# Patient Record
Sex: Female | Born: 1945 | Race: Black or African American | Hispanic: No | Smoking: Never smoker
Health system: Southern US, Community
[De-identification: ages and names within clinical notes are randomized; demographics above are authoritative.]

## PROBLEM LIST (undated history)

## (undated) DIAGNOSIS — Z8673 Personal history of transient ischemic attack (TIA), and cerebral infarction without residual deficits: Secondary | ICD-10-CM

## (undated) DIAGNOSIS — E1129 Type 2 diabetes mellitus with other diabetic kidney complication: Secondary | ICD-10-CM

## (undated) DIAGNOSIS — R601 Generalized edema: Secondary | ICD-10-CM

## (undated) DIAGNOSIS — I480 Paroxysmal atrial fibrillation: Secondary | ICD-10-CM

## (undated) DIAGNOSIS — E7800 Pure hypercholesterolemia, unspecified: Secondary | ICD-10-CM

## (undated) DIAGNOSIS — N183 Chronic kidney disease, stage 3 unspecified: Secondary | ICD-10-CM

## (undated) DIAGNOSIS — I10 Essential (primary) hypertension: Secondary | ICD-10-CM

## (undated) HISTORY — PX: OTHER SURGICAL HISTORY: SHX169

---

## 2015-02-19 ENCOUNTER — Encounter (HOSPITAL_COMMUNITY): Payer: Self-pay | Admitting: Internal Medicine

## 2015-02-19 ENCOUNTER — Inpatient Hospital Stay (HOSPITAL_COMMUNITY)
Admission: AD | Admit: 2015-02-19 | Discharge: 2015-02-23 | DRG: 683 | Disposition: A | Discharge status: Skilled Nursing Facility | Payer: Medicare Other | Source: Other Acute Inpatient Hospital | Attending: Internal Medicine | Admitting: Internal Medicine

## 2015-02-19 DIAGNOSIS — E1129 Type 2 diabetes mellitus with other diabetic kidney complication: Secondary | ICD-10-CM | POA: Diagnosis present

## 2015-02-19 DIAGNOSIS — N179 Acute kidney failure, unspecified: Secondary | ICD-10-CM | POA: Diagnosis present

## 2015-02-19 DIAGNOSIS — Z794 Long term (current) use of insulin: Secondary | ICD-10-CM

## 2015-02-19 DIAGNOSIS — D649 Anemia, unspecified: Secondary | ICD-10-CM | POA: Diagnosis present

## 2015-02-19 DIAGNOSIS — I69920 Aphasia following unspecified cerebrovascular disease: Secondary | ICD-10-CM

## 2015-02-19 DIAGNOSIS — E8770 Fluid overload, unspecified: Secondary | ICD-10-CM | POA: Diagnosis present

## 2015-02-19 DIAGNOSIS — E11649 Type 2 diabetes mellitus with hypoglycemia without coma: Secondary | ICD-10-CM | POA: Diagnosis present

## 2015-02-19 DIAGNOSIS — I10 Essential (primary) hypertension: Secondary | ICD-10-CM | POA: Diagnosis present

## 2015-02-19 DIAGNOSIS — J811 Chronic pulmonary edema: Secondary | ICD-10-CM

## 2015-02-19 DIAGNOSIS — Z8673 Personal history of transient ischemic attack (TIA), and cerebral infarction without residual deficits: Secondary | ICD-10-CM

## 2015-02-19 DIAGNOSIS — N184 Chronic kidney disease, stage 4 (severe): Secondary | ICD-10-CM | POA: Diagnosis present

## 2015-02-19 DIAGNOSIS — Z66 Do not resuscitate: Secondary | ICD-10-CM | POA: Diagnosis present

## 2015-02-19 DIAGNOSIS — E1122 Type 2 diabetes mellitus with diabetic chronic kidney disease: Secondary | ICD-10-CM | POA: Diagnosis present

## 2015-02-19 DIAGNOSIS — N170 Acute kidney failure with tubular necrosis: Secondary | ICD-10-CM

## 2015-02-19 DIAGNOSIS — Z7401 Bed confinement status: Secondary | ICD-10-CM | POA: Diagnosis not present

## 2015-02-19 DIAGNOSIS — N390 Urinary tract infection, site not specified: Secondary | ICD-10-CM | POA: Diagnosis present

## 2015-02-19 DIAGNOSIS — I129 Hypertensive chronic kidney disease with stage 1 through stage 4 chronic kidney disease, or unspecified chronic kidney disease: Secondary | ICD-10-CM | POA: Diagnosis present

## 2015-02-19 DIAGNOSIS — E780 Pure hypercholesterolemia: Secondary | ICD-10-CM | POA: Diagnosis present

## 2015-02-19 DIAGNOSIS — R601 Generalized edema: Secondary | ICD-10-CM | POA: Diagnosis not present

## 2015-02-19 DIAGNOSIS — R0602 Shortness of breath: Secondary | ICD-10-CM | POA: Diagnosis present

## 2015-02-19 DIAGNOSIS — N189 Chronic kidney disease, unspecified: Secondary | ICD-10-CM

## 2015-02-19 DIAGNOSIS — I69922 Dysarthria following unspecified cerebrovascular disease: Secondary | ICD-10-CM

## 2015-02-19 DIAGNOSIS — I480 Paroxysmal atrial fibrillation: Secondary | ICD-10-CM | POA: Diagnosis present

## 2015-02-19 DIAGNOSIS — Z6841 Body Mass Index (BMI) 40.0 and over, adult: Secondary | ICD-10-CM

## 2015-02-19 DIAGNOSIS — Z7982 Long term (current) use of aspirin: Secondary | ICD-10-CM | POA: Diagnosis not present

## 2015-02-19 HISTORY — DX: Essential (primary) hypertension: I10

## 2015-02-19 HISTORY — DX: Paroxysmal atrial fibrillation: I48.0

## 2015-02-19 HISTORY — DX: Personal history of transient ischemic attack (TIA), and cerebral infarction without residual deficits: Z86.73

## 2015-02-19 HISTORY — DX: Generalized edema: R60.1

## 2015-02-19 HISTORY — DX: Pure hypercholesterolemia, unspecified: E78.00

## 2015-02-19 HISTORY — DX: Chronic kidney disease, stage 3 (moderate): N18.3

## 2015-02-19 HISTORY — DX: Type 2 diabetes mellitus with other diabetic kidney complication: E11.29

## 2015-02-19 HISTORY — DX: Chronic kidney disease, stage 3 unspecified: N18.30

## 2015-02-19 LAB — PROTIME-INR
INR: 1.53 — ABNORMAL HIGH (ref 0.00–1.49)
PROTHROMBIN TIME: 18.6 s — AB (ref 11.6–15.2)

## 2015-02-19 LAB — GLUCOSE, CAPILLARY
GLUCOSE-CAPILLARY: 138 mg/dL — AB (ref 65–99)
GLUCOSE-CAPILLARY: 154 mg/dL — AB (ref 65–99)

## 2015-02-19 LAB — MRSA PCR SCREENING: MRSA BY PCR: NEGATIVE

## 2015-02-19 LAB — TSH: TSH: 1.03 u[IU]/mL (ref 0.350–4.500)

## 2015-02-19 MED ORDER — ONDANSETRON HCL 4 MG PO TABS: 4.0000 mg | ORAL_TABLET | Freq: Four times a day (QID) | ORAL | Status: DC | PRN

## 2015-02-19 MED ORDER — ASPIRIN 81 MG PO CHEW
81.0000 mg | CHEWABLE_TABLET | Freq: Every day | ORAL | Status: DC
  Administered 2015-02-20 – 2015-02-23 (×4): 81 mg via ORAL
  Filled 2015-02-19 (×4): qty 1

## 2015-02-19 MED ORDER — HYDROCODONE-ACETAMINOPHEN 5-325 MG PO TABS
1.0000 | ORAL_TABLET | ORAL | Status: DC | PRN
  Administered 2015-02-22 – 2015-02-23 (×4): 2 via ORAL
  Filled 2015-02-19 (×4): qty 2

## 2015-02-19 MED ORDER — INSULIN GLARGINE 100 UNIT/ML ~~LOC~~ SOLN
40.0000 [IU] | Freq: Every day | SUBCUTANEOUS | Status: DC
  Administered 2015-02-19: 40 [IU] via SUBCUTANEOUS
  Filled 2015-02-19 (×2): qty 0.4

## 2015-02-19 MED ORDER — ONDANSETRON HCL 4 MG/2ML IJ SOLN: 4.0000 mg | Freq: Four times a day (QID) | INTRAMUSCULAR | Status: DC | PRN

## 2015-02-19 MED ORDER — POLYETHYLENE GLYCOL 3350 17 G PO PACK
17.0000 g | PACK | Freq: Every day | ORAL | Status: DC
  Administered 2015-02-20 – 2015-02-23 (×4): 17 g via ORAL
  Filled 2015-02-19 (×5): qty 1

## 2015-02-19 MED ORDER — SODIUM CHLORIDE 0.9 % IJ SOLN
3.0000 mL | Freq: Two times a day (BID) | INTRAMUSCULAR | Status: DC
  Administered 2015-02-19 – 2015-02-22 (×4): 3 mL via INTRAVENOUS

## 2015-02-19 MED ORDER — FUROSEMIDE 10 MG/ML IJ SOLN
80.0000 mg | Freq: Two times a day (BID) | INTRAMUSCULAR | Status: DC
  Administered 2015-02-19 – 2015-02-20 (×2): 80 mg via INTRAVENOUS
  Filled 2015-02-19 (×4): qty 8

## 2015-02-19 MED ORDER — INSULIN ASPART 100 UNIT/ML ~~LOC~~ SOLN
0.0000 [IU] | Freq: Three times a day (TID) | SUBCUTANEOUS | Status: DC
  Administered 2015-02-20 (×2): 3 [IU] via SUBCUTANEOUS
  Administered 2015-02-21: 2 [IU] via SUBCUTANEOUS

## 2015-02-19 MED ORDER — ACETAMINOPHEN 325 MG PO TABS
650.0000 mg | ORAL_TABLET | Freq: Four times a day (QID) | ORAL | Status: DC | PRN
  Administered 2015-02-23: 650 mg via ORAL
  Filled 2015-02-19: qty 2

## 2015-02-19 MED ORDER — HEPARIN (PORCINE) IN NACL 100-0.45 UNIT/ML-% IJ SOLN
1600.0000 [IU]/h | INTRAMUSCULAR | Status: DC
  Administered 2015-02-20: 1100 [IU]/h via INTRAVENOUS
  Filled 2015-02-19 (×3): qty 250

## 2015-02-19 MED ORDER — HEPARIN SODIUM (PORCINE) 5000 UNIT/ML IJ SOLN
5000.0000 [IU] | Freq: Three times a day (TID) | INTRAMUSCULAR | Status: DC
Start: 2015-02-19 — End: 2015-02-19

## 2015-02-19 MED ORDER — MORPHINE SULFATE 2 MG/ML IJ SOLN
1.0000 mg | INTRAMUSCULAR | Status: DC | PRN
  Administered 2015-02-22: 1 mg via INTRAVENOUS
  Filled 2015-02-19: qty 1

## 2015-02-19 MED ORDER — ACETAMINOPHEN 650 MG RE SUPP
650.0000 mg | Freq: Four times a day (QID) | RECTAL | Status: DC | PRN
Start: 2015-02-19 — End: 2015-02-23

## 2015-02-19 NOTE — Progress Notes (Signed)
MD order states NPO after midnight (for swallow eval) RN advised pt and family not to eat because of swallow eval. Pt. Son requesting food for pt. Myself and charge nurse discussed risks of eating before a swallow evaluation. Pt son adamant about getting pt food.

## 2015-02-19 NOTE — Consult Note (Signed)
PHARMACY CONSULT NOTE  Pharmacy Consult :  Heparin Indication : Bridging while Xarelto being held  Allergies: Codeine, IV Dye, Crestor  Heparin Dosing weight : 85 kg  Vital Signs: BP 116/75 mmHg  Pulse 89  Temp(Src) 98.1 F (36.7 C) (Oral)  Resp 20  Ht 5\' 4"  (1.626 m)  Wt 278 lb 8 oz (126.327 kg)  BMI 47.78 kg/m2  SpO2 96%  Active Problems: Active Problems:   Acute renal failure   DM (diabetes mellitus), type 2 with renal complications   Anasarca   Morbid obesity   Paroxysmal atrial fibrillation   Benign essential HTN   Bedridden   History of CVA (cerebrovascular accident)   Labs: No results for input(s): HGB, HCT, PLT, APTT, LABPROT, INR, HEPARINUNFRC, CREATININE in the last 72 hours. No results found for: INR, PROTIME CrCl cannot be calculated (Patient has no serum creatinine result on file.).  Labs from [facility] transfer information: Last INR 1.5,  Hgb 10.7, HCT 34.1, Plt 202.   Medical / Surgical History: Past Medical History  Diagnosis Date  . CKD (chronic kidney disease), stage III   . History of CVA (cerebrovascular accident)   . Paroxysmal atrial fibrillation   . HTN (hypertension)   . Hypercholesterolemia   . DM (diabetes mellitus), type 2 with renal complications    Past Surgical History  Procedure Laterality Date  . Right knee surgery      MEDICATION: Medication PTA: Medication Sig  . ADVAIR DISKUS 250-50 MCG/DOSE AEPB   . amLODipine (NORVASC) 2.5 MG tablet Take 2.5 mg by mouth daily.  Marland Kitchen. atorvastatin (LIPITOR) 10 MG tablet Take 10 mg by mouth daily.  . ciprofloxacin (CILOXAN) 0.3 % ophthalmic solution   . fluticasone (FLONASE) 50 MCG/ACT nasal spray   . furosemide (LASIX) 20 MG tablet Take 20 mg by mouth.  . furosemide (LASIX) 80 MG tablet Take 80 mg by mouth.  . gabapentin (NEURONTIN) 300 MG capsule Take 300 mg by mouth 3 (three) times daily.  Marland Kitchen. HUMALOG KWIKPEN 100 UNIT/ML KiwkPen   . hydrALAZINE (APRESOLINE) 25 MG tablet Take 25  mg by mouth.  Marland Kitchen. ipratropium-albuterol (DUONEB) 0.5-2.5 (3) MG/3ML SOLN   . KLOR-CON M20 20 MEQ tablet Take 20 mEq by mouth daily.  Marland Kitchen. LANTUS SOLOSTAR 100 UNIT/ML Solostar Pen   . losartan (COZAAR) 50 MG tablet Take 50 mg by mouth daily.  . metFORMIN (GLUCOPHAGE) 500 MG tablet Take 500 mg by mouth 2 (two) times daily.  . metoprolol (LOPRESSOR) 50 MG tablet Take 50 mg by mouth 2 (two) times daily.  . mometasone (NASONEX) 50 MCG/ACT nasal spray   . oxyCODONE (OXY IR/ROXICODONE) 5 MG immediate release tablet Take 5 mg by mouth.  . predniSONE (DELTASONE) 20 MG tablet   . promethazine (PHENERGAN) 25 MG tablet Take 25 mg by mouth.  . SPIRIVA HANDIHALER 18 MCG inhalation capsule   . XARELTO 15 MG TABS tablet Take 15 mg by mouth daily.    Scheduled:  Scheduled:  . aspirin  81 mg Oral Daily  . furosemide  80 mg Intravenous BID  . [START ON 02/20/2015] insulin aspart  0-15 Units Subcutaneous TID WC  . insulin glargine  40 Units Subcutaneous QHS  . polyethylene glycol  17 g Oral Daily  . sodium chloride  3 mL Intravenous Q12H   Infusion[s]: Infusions:   Antibiotic[s]: Anti-infectives    None      ASSESSMENT:  69 y.o. female is currently on chronic Xarelto for Hx of CVA and PAF.  Patient took last Xarelto dose this AM @ 10:22 AM.  Heparin to be started as per protocol.    Given patient's chronic renal failure and history of Xarelto, Heparin will be started 24 hours after last Xarelto dose.  GOAL:  Heparin Level  0.3 - 0.7 units/ml   PLAN: 1. DC Xarelto, DC  SQ Heparin [done]. 2. Tomorrow morning, Heparin will be started, no bolus, at 1100 units/hr.    3. The first Heparin level will be checked in 8 hours 4. Daily Heparin level, CBC while on Heparin.  Monitor for bleeding complications. Follow Platelet counts.  Velda ShellEarle Carrieanne Kleen,  Pharm.D   02/19/2015,  7:10 PM. 02/19/2015,  7:10 PM

## 2015-02-19 NOTE — Progress Notes (Signed)
New Admission Note:    Arrival Method:  Care Link  Mental Orientation: Alert, oriented to perso, place, situation Assessment: See Doc Flowsheets  Skin: Dry, cracking feet  IV: no peripheral IV, Triple lumen right IJ Pain: no complaints  Tubes: 02 at 2L Safety Measures: Safety Fall Prevention Plan has been given, discussed and signed Admission:See Doc flowsheets  [address] Orientation: Patient has been orientated to the room, unit and staff.  Family: none at bedside, per EMS they should arrive shortly   Flow manager contacted    Riesa PopeJasmine Alann Avey BSN, RN Phone number: [phone]

## 2015-02-19 NOTE — H&P (Signed)
[facility] History and Physical  Cassidy AntonMary F Glassberg ZOX:[id number] DOB: 08/03/1946 DOA: 02/19/2015  [facility] PCP: Galvin ProfferHAGUE, IMRAN P, MD   Chief Complaint: shortness of breath  HPI: Cassidy Atkins is a 69 y.o.African-American female past medical history of CVA, patient is bedridden since about 2 years ago, [facility] in [facility] [place]. Patient does have CKD stage III with baseline creatinine about 3.5, paroxysmal atrial fibrillation on Xarelto and hypertension. Brought to the hospital because of shortness of breath and swelling. Patient mentioned for the past 2 weeks prior to admission she started to have more swelling in her legs, developed more shortness of breath so the nursing staff [facility]. She was seen by Dr. With one of the nephrologists of [facility] and he recommended for her tobe transferred [facility] for further evaluation.  Review of Systems:  Constitutional: negative for anorexia, fevers and sweats Eyes: negative for irritation, redness and visual disturbance Ears, nose, mouth, throat, and face: negative for earaches, epistaxis, nasal congestion and sore throat Respiratory: shortness of breath Cardiovascular: negative for chest pain, dyspnea, lower extremity edema, orthopnea, palpitations and syncope Gastrointestinal: negative for abdominal pain, constipation, diarrhea, melena, nausea and vomiting Genitourinary:negative for dysuria, frequency and hematuria Hematologic/lymphatic: negative for bleeding, easy bruising and lymphadenopathy Musculoskeletal:lower extremity edema Neurological: negative for coordination problems, gait problems, headaches and weakness Endocrine: negative for diabetic symptoms including polydipsia, polyuria and weight loss Allergic/Immunologic: negative for anaphylaxis, hay fever and urticaria  Past Medical History  Diagnosis Date   . CKD (chronic kidney disease), stage III   . History of CVA (cerebrovascular accident)   . Paroxysmal atrial fibrillation   . HTN (hypertension)   . Hypercholesterolemia   . DM (diabetes mellitus), type 2 with renal complications    Past Surgical History  Procedure Laterality Date  . Right knee surgery     Social History:   has no tobacco, alcohol, and drug history on file. No smoking, no alcohol abuse   Allergies not on file  Family history Family history of hypertension and diabetes, denies cancer, stroke and cardiac disorder.  Prior to Admission medications   Not on File   Physical Exam: Filed Vitals:   02/19/15 1645  BP: 116/75  Pulse: 89  Temp: 98.1 F (36.7 C)  Resp: 20   Constitutional: Oriented to person, place, and time. Well-developed and well-nourished. Cooperative.  Head: Normocephalic and atraumatic.  Nose: Nose normal.  Mouth/Throat: Uvula is midline, oropharynx is clear and moist and mucous membranes are normal.  Eyes: Conjunctivae and EOM are normal. Pupils are equal, round, and reactive to light.  Neck: Trachea normal and normal range of motion. Neck supple.  Cardiovascular: Normal rate, regular rhythm, S1 normal, S2 normal, normal heart sounds and intact distal pulses.   Pulmonary/Chest: Effort normal and breath sounds normal.  Abdominal: Soft. Bowel sounds are normal. There is no hepatosplenomegaly. There is no tenderness.  Musculoskeletal: +1 lower extremity edema Neurological: Alert and oriented to person, place, and time. Has normal strength. No cranial nerve deficit or sensory deficit.  Skin: Skin is warm, dry and intact.  Psychiatric: Has a normal mood and affect. Speech is normal and behavior is normal.   Labs on Admission:  Basic Metabolic Panel: [facility] earlier today Sodium 141 potassium 5.1 chloride 99 bicarbonate 27 glucose 142 BUN 80 creatinine 1.3 calcium is 8.7  CBC: WBC 9.1 hemoglobin 10.7 hematocrit 34.1 platelets  202  Cardiac Enzymes: No results for input(s): CKTOTAL, CKMB, CKMBINDEX, TROPONINI in the last 168 hours.  BNP (last 3 results) No results for input(s): BNP in the last 8760 hours.  ProBNP (last 3 results) No results for input(s): PROBNP in the last 8760 hours.  CBG:  Recent Labs Lab 02/19/15 1633  GLUCAP 138*   2-D echo report done on 02/19/2015. 1. This was technically difficult study with suboptimal views, patient unable to cooperate. 2. Left ventricular wall thickness is normal. 3. Overall left ventricular systolic function is normal with ejection fraction between 60 and 65%. 4. Mild to moderate tricuspid regurgitation. 5. The right ventricular systolic pressure as measured by Doppler is 50 mmHg. 6. The inferior vena cava is mildly dilated.  Radiological Exams on Admission: No results found.  EKG: pending at the time of admission.  Assessment/Plan Active Problems:   Acute renal failure   DM (diabetes mellitus), type 2 with renal complications   Anasarca   Morbid obesity   Paroxysmal atrial fibrillation   Benign essential HTN   Bedridden   History of CVA (cerebrovascular accident)    Acute renal failure Acute on chronic renal failure, baseline creatinine around 3.5. Presented with creatinine of 3.9. After initiation of diuresis creatinine increased to 4.3. Patient has fluid overload and anasarca. Currently oliguric without much of urine output. Nephrology consultation was obtained earlier [facility] as family flip-flopping between hospice versus dialysis. Currently patient has mild shortness of breath, does not appear to be in florid pulmonary edema. Lower extremity are big, part of it probably morbid obesity, not very impressive pitting edema in the lower extremities. Nephrology to evaluate for further evaluation.  Diabetes mellitus type 2 Patient started on carbohydrate modified diet with renal restriction low potassium and fluid  restriction. Sensitive insulin sliding scale. Check hemoglobin A1c.  Paroxysmal atrial fibrillation Rate is controlled, check 12-lead EKG. Patient is on Xarelto, I will hold for now, start on heparin drip as patient might need procedure for placement of dialysis access.  History of CVA Her family patient has had CVA about 3 years ago, she's been in nursing home for the past 2 years She's been bedridden for the past 2 years, she has significant dysarthria. Family reports that she has been eating okay without any problems, SLP to evaluate.  Code Status: was DNR/DNI, family wants her to be full code Family Communication: discussed with family at bedside, daughter and son in multiple family members at bedside. Disposition Plan: telemetry, inpatient.  Time spent: 70 minutes  Minahil Quinlivan A, MD [facility] Pager [phone]

## 2015-02-20 ENCOUNTER — Inpatient Hospital Stay (HOSPITAL_COMMUNITY): Payer: Medicare Other

## 2015-02-20 ENCOUNTER — Encounter (HOSPITAL_COMMUNITY): Payer: Self-pay | Admitting: General Practice

## 2015-02-20 DIAGNOSIS — N179 Acute kidney failure, unspecified: Secondary | ICD-10-CM | POA: Diagnosis present

## 2015-02-20 LAB — BASIC METABOLIC PANEL
Anion gap: 12 (ref 5–15)
BUN: 87 mg/dL — ABNORMAL HIGH (ref 6–20)
CO2: 26 mmol/L (ref 22–32)
Calcium: 8.5 mg/dL — ABNORMAL LOW (ref 8.9–10.3)
Chloride: 101 mmol/L (ref 101–111)
Creatinine, Ser: 3.69 mg/dL — ABNORMAL HIGH (ref 0.44–1.00)
GFR, EST AFRICAN AMERICAN: 13 mL/min — AB (ref >60)
GFR, EST NON AFRICAN AMERICAN: 11 mL/min — AB (ref >60)
Glucose, Bld: 132 mg/dL — ABNORMAL HIGH (ref 65–99)
POTASSIUM: 4.9 mmol/L (ref 3.5–5.1)
Sodium: 139 mmol/L (ref 135–145)

## 2015-02-20 LAB — LIPID PANEL
CHOL/HDL RATIO: 3.4 ratio
CHOLESTEROL: 110 mg/dL (ref 0–200)
HDL: 32 mg/dL — ABNORMAL LOW (ref >40)
LDL Cholesterol: 57 mg/dL (ref 0–99)
Triglycerides: 104 mg/dL (ref <150)
VLDL: 21 mg/dL (ref 0–40)

## 2015-02-20 LAB — URINALYSIS, ROUTINE W REFLEX MICROSCOPIC
Bilirubin Urine: NEGATIVE
Glucose, UA: NEGATIVE mg/dL
Ketones, ur: NEGATIVE mg/dL
Nitrite: NEGATIVE
PH: 5 (ref 5.0–8.0)
PROTEIN: 30 mg/dL — AB
SPECIFIC GRAVITY, URINE: 1.012 (ref 1.005–1.030)
Urobilinogen, UA: 0.2 mg/dL (ref 0.0–1.0)

## 2015-02-20 LAB — HEMOGLOBIN A1C
HEMOGLOBIN A1C: 7.5 % — AB (ref 4.8–5.6)
MEAN PLASMA GLUCOSE: 169 mg/dL

## 2015-02-20 LAB — GLUCOSE, CAPILLARY
GLUCOSE-CAPILLARY: 179 mg/dL — AB (ref 65–99)
GLUCOSE-CAPILLARY: 187 mg/dL — AB (ref 65–99)
Glucose-Capillary: 113 mg/dL — ABNORMAL HIGH (ref 65–99)
Glucose-Capillary: 198 mg/dL — ABNORMAL HIGH (ref 65–99)

## 2015-02-20 LAB — URINE MICROSCOPIC-ADD ON

## 2015-02-20 LAB — CBC
HCT: 32.8 % — ABNORMAL LOW (ref 36.0–46.0)
HEMOGLOBIN: 10.2 g/dL — AB (ref 12.0–15.0)
MCH: 26.8 pg (ref 26.0–34.0)
MCHC: 31.1 g/dL (ref 30.0–36.0)
MCV: 86.3 fL (ref 78.0–100.0)
Platelets: 188 10*3/uL (ref 150–400)
RBC: 3.8 MIL/uL — ABNORMAL LOW (ref 3.87–5.11)
RDW: 17.6 % — AB (ref 11.5–15.5)
WBC: 7 10*3/uL (ref 4.0–10.5)

## 2015-02-20 LAB — HEPARIN LEVEL (UNFRACTIONATED): HEPARIN UNFRACTIONATED: 0.63 [IU]/mL (ref 0.30–0.70)

## 2015-02-20 LAB — APTT: APTT: 43 s — AB (ref 24–37)

## 2015-02-20 MED ORDER — SODIUM CHLORIDE 0.9 % IJ SOLN: 10.0000 mL | Freq: Two times a day (BID) | INTRAMUSCULAR | Status: DC

## 2015-02-20 MED ORDER — SODIUM CHLORIDE 0.9 % IJ SOLN
10.0000 mL | INTRAMUSCULAR | Status: DC | PRN
  Administered 2015-02-20: 10 mL
  Administered 2015-02-20: 20 mL
  Filled 2015-02-20 (×2): qty 40

## 2015-02-20 MED ORDER — INSULIN ASPART 100 UNIT/ML ~~LOC~~ SOLN
10.0000 [IU] | Freq: Three times a day (TID) | SUBCUTANEOUS | Status: DC
  Administered 2015-02-21 – 2015-02-22 (×4): 10 [IU] via SUBCUTANEOUS

## 2015-02-20 MED ORDER — FUROSEMIDE 10 MG/ML IJ SOLN
40.0000 mg | Freq: Two times a day (BID) | INTRAMUSCULAR | Status: DC
  Administered 2015-02-20 – 2015-02-21 (×2): 40 mg via INTRAVENOUS
  Filled 2015-02-20 (×2): qty 4

## 2015-02-20 MED ORDER — CIPROFLOXACIN IN D5W 400 MG/200ML IV SOLN
400.0000 mg | INTRAVENOUS | Status: DC
  Administered 2015-02-20 – 2015-02-23 (×4): 400 mg via INTRAVENOUS
  Filled 2015-02-20 (×4): qty 200

## 2015-02-20 MED ORDER — INSULIN GLARGINE 100 UNIT/ML ~~LOC~~ SOLN
60.0000 [IU] | Freq: Every day | SUBCUTANEOUS | Status: DC
  Administered 2015-02-20: 60 [IU] via SUBCUTANEOUS
  Filled 2015-02-20 (×2): qty 0.6

## 2015-02-20 NOTE — Progress Notes (Signed)
Subjective:  Events noted and chart reviewed - AKI at [facility], creatinine up to 4.3 on 5/12 with minimal UOP- also looks like UTI- was transferred here for need of dialysis- initially was something family did not want finally had compromised on a trial of 2 weeks for dialysis to see if she could regain function because due to bedbound status for 2 years plus not a candidate for OP dialysis.  As luck would have it, she is now making a lot of urine- had 1200 ouit and foley bag is full and creatinine down to 3.7   Objective Vital signs in last 24 hours: Filed Vitals:   02/19/15 1853 02/19/15 2119 02/20/15 0600 02/20/15 0923  BP:  118/69 106/46 104/58  Pulse:  61 56 60  Temp:  98.3 F (36.8 C) 98.5 F (36.9 C) 97.7 F (36.5 C)  TempSrc:  Oral Oral Oral  Resp:  19 19 22   Height: 5\' 4"  (1.626 m) 5\' 4"  (1.626 m)    Weight: 126.327 kg (278 lb 8 oz) 126.327 kg (278 lb 8 oz)    SpO2:  100% 100% 100%   Weight change:   Intake/Output Summary (Last 24 hours) at 02/20/15 1224 Last data filed at 02/20/15 0900  Gross per 24 hour  Intake      0 ml  Output   1200 ml  Net  -1200 ml    Assessment/ Plan: Pt is a 69 y.o. yo female - DM/HTN/obesity/ bedbound NH patient after CVA with aspiration precautions and aphasia who was admitted on 02/19/2015 with worsening volume status and AKI with UTI  Assessment/Plan: 1. Renal- creatinine noted to be 1.7   6 months ago.  Now with A on CRF in the setting of UTI and previous losartan.  Hopefully has started to turn the corner with great UOP and decrease of creatinine.  Will hold off on initiation of dialysis at this point and follow clinically.  I attempted to call family with good news but could not get a hold of anyone.  2. UTI- send culture and give cipro  3. Anemia- hgb 10.2- supportive care for now  4. HTN/volume- very overloaded- on lasix IV 80 q 12- brisk response- blood pressure lowish  So will back down to 40 q 12  possible post injury diuresis is what  we are seeing   Grabiela Wohlford A    Labs: Basic Metabolic Panel:  Recent Labs Lab 02/20/15 0530  NA 139  K 4.9  CL 101  CO2 26  GLUCOSE 132*  BUN 87*  CREATININE 3.69*  CALCIUM 8.5*   Liver Function Tests: No results for input(s): AST, ALT, ALKPHOS, BILITOT, PROT, ALBUMIN in the last 168 hours. No results for input(s): LIPASE, AMYLASE in the last 168 hours. No results for input(s): AMMONIA in the last 168 hours. CBC:  Recent Labs Lab 02/20/15 0530  WBC 7.0  HGB 10.2*  HCT 32.8*  MCV 86.3  PLT 188   Cardiac Enzymes: No results for input(s): CKTOTAL, CKMB, CKMBINDEX, TROPONINI in the last 168 hours. CBG:  Recent Labs Lab 02/19/15 1633 02/19/15 2118 02/20/15 0744 02/20/15 1203  GLUCAP 138* 154* 113* 198*    Iron Studies: No results for input(s): IRON, TIBC, TRANSFERRIN, FERRITIN in the last 72 hours. Studies/Results: Dg Chest Port 1v Same Day  02/20/2015   CLINICAL DATA:  Followup pulmonary edema.  EXAM: PORTABLE CHEST - 1 VIEW SAME DAY  COMPARISON:  02/18/2015  FINDINGS: Moderate enlargement of the cardiopericardial silhouette stable. No mediastinal  or hilar masses.  Vascular congestion persists without overt pulmonary edema. There is right lung base opacity likely atelectasis. Possible small right effusion. No areas of lung consolidation to suggest pneumonia. No pneumothorax.  Right internal jugular central venous line is new from the prior study. Tip lies in the lower superior vena cava near the caval atrial junction.  IMPRESSION: 1. New right internal jugular central venous line since the prior chest radiograph. Tip lies in the lower superior vena cava, well positioned. No pneumothorax. 2. No other change. Persistent cardiomegaly and vascular congestion without overt pulmonary edema.   Electronically Signed   By: Amie Portlandavid  Ormond M.D.   On: 02/20/2015 09:57   Medications: Infusions: . heparin 1,100 Units/hr (02/20/15 0905)    Scheduled Medications: .  aspirin  81 mg Oral Daily  . furosemide  80 mg Intravenous BID  . insulin aspart  0-15 Units Subcutaneous TID WC  . insulin glargine  40 Units Subcutaneous QHS  . polyethylene glycol  17 g Oral Daily  . sodium chloride  10-40 mL Intracatheter Q12H  . sodium chloride  3 mL Intravenous Q12H    have reviewed scheduled and prn medications.  Physical Exam: General: obese- seems to understand what I am saying- aphasia- coughing Heart: RRR Lungs: poor effort, CBS Abdomen: obese, soft, non tender Extremities: pitting edema    02/20/2015,12:24 PM  LOS: 1 day

## 2015-02-20 NOTE — Progress Notes (Signed)
[facility] PROGRESS NOTE   Cassidy Atkins [id number] DOB: 01/02/1946 DOA: 02/19/2015 PCP: Galvin ProfferHAGUE, IMRAN P, MD  HPI/Subjective: Feels better, shortness of breath is better, less cough and sputum production. Chest x-ray showed vascular congestion and cardiomegaly  Assessment/Plan: Active Problems:   Acute renal failure   DM (diabetes mellitus), type 2 with renal complications   Anasarca   Morbid obesity   Paroxysmal atrial fibrillation   Benign essential HTN   Bedridden   History of CVA (cerebrovascular accident)   ARF (acute renal failure)   Acute renal failure Acute on chronic renal failure, baseline creatinine around 3.5. Presented with creatinine of 3.9. Reported to be oliguric and creatinine worsened to 4.3 after initiation of Lasix. Nephrology consulted, Lasix initiated at 80 mg IV twice a day. Patient had good urine output of 1200 mL yesterday, creatinine decreased to 3.69. Continue diuresis, follow urine output. No plans for dialysis for now.  Diabetes mellitus type 2 Patient started on carbohydrate modified diet with renal restriction low potassium and fluid restriction. Hemoglobin A1c is 7.5. Restarted on insulin and only 4 units (it was reported she is on 100 units daily at bedtime) Increase Lantus to 60 units at night and start on 10 units of NovoLog per meal coverage.  Paroxysmal atrial fibrillation Rate is controlled, check 12-lead EKG. Patient is on Xarelto, on hold, on heparin drip. Not sure if her creatinine will allow novel anticoagulants.  History of CVA Her family patient has had CVA about 3 years ago, she's been in nursing home for the past 2 years She's been bedridden for the past 2 years, she has significant dysarthria.  Dysphagia Secondary to previous CVA, seen by SLP and recommended dysphagia 2 with thin liquids.  Morbid obesity BMI is 47.9.  Code Status: Full Code Family Communication: Plan discussed with the patient. Disposition  Plan: Remains inpatient Diet: DIET DYS 2 Room service appropriate?: Yes; Fluid consistency:: Thin; Fluid restriction:: 1200 mL Fluid  Consultants:  Nephrology  Procedures:  None  Antibiotics:  None   Objective: Filed Vitals:   02/20/15 0923  BP: 104/58  Pulse: 60  Temp: 97.7 F (36.5 C)  Resp: 22    Intake/Output Summary (Last 24 hours) at 02/20/15 1437 Last data filed at 02/20/15 0900  Gross per 24 hour  Intake      0 ml  Output   1200 ml  Net  -1200 ml   Filed Weights   02/19/15 1853 02/19/15 2119  Weight: 126.327 kg (278 lb 8 oz) 126.327 kg (278 lb 8 oz)    Exam: General: Alert and awake, oriented x3, not in any acute distress. Morbidly obese African-American female HEENT: anicteric sclera, pupils reactive to light and accommodation, EOMI CVS: S1-S2 clear, no murmur rubs or gallops Chest: clear to auscultation bilaterally, no wheezing, rales or rhonchi Abdomen: soft nontender, nondistended, normal bowel sounds, no organomegaly Extremities: no cyanosis, clubbing or edema noted bilaterally Neuro: Cranial nerves II-XII intact, no focal neurological deficits  Data Reviewed: Basic Metabolic Panel:  Recent Labs Lab 02/20/15 0530  NA 139  K 4.9  CL 101  CO2 26  GLUCOSE 132*  BUN 87*  CREATININE 3.69*  CALCIUM 8.5*   Liver Function Tests: No results for input(s): AST, ALT, ALKPHOS, BILITOT, PROT, ALBUMIN in the last 168 hours. No results for input(s): LIPASE, AMYLASE in the last 168 hours. No results for input(s): AMMONIA in the last 168 hours. CBC:  Recent Labs Lab 02/20/15 0530  WBC 7.0  HGB  10.2*  HCT 32.8*  MCV 86.3  PLT 188   Cardiac Enzymes: No results for input(s): CKTOTAL, CKMB, CKMBINDEX, TROPONINI in the last 168 hours. BNP (last 3 results) No results for input(s): BNP in the last 8760 hours.  ProBNP (last 3 results) No results for input(s): PROBNP in the last 8760 hours.  CBG:  Recent Labs Lab 02/19/15 1633 02/19/15 2118  02/20/15 0744 02/20/15 1203  GLUCAP 138* 154* 113* 198*    Micro Recent Results (from the past 240 hour(s))  MRSA PCR Screening     Status: None   Collection Time: 02/19/15  7:01 PM  Result Value Ref Range Status   MRSA by PCR NEGATIVE NEGATIVE Final    Comment:        The GeneXpert MRSA Assay (FDA approved for NASAL specimens only), is one component of a comprehensive MRSA colonization surveillance program. It is not intended to diagnose MRSA infection nor to guide or monitor treatment for MRSA infections.      Studies: Dg Chest Port 1v Same Day  02/20/2015   CLINICAL DATA:  Followup pulmonary edema.  EXAM: PORTABLE CHEST - 1 VIEW SAME DAY  COMPARISON:  02/18/2015  FINDINGS: Moderate enlargement of the cardiopericardial silhouette stable. No mediastinal or hilar masses.  Vascular congestion persists without overt pulmonary edema. There is right lung base opacity likely atelectasis. Possible small right effusion. No areas of lung consolidation to suggest pneumonia. No pneumothorax.  Right internal jugular central venous line is new from the prior study. Tip lies in the lower superior vena cava near the caval atrial junction.  IMPRESSION: 1. New right internal jugular central venous line since the prior chest radiograph. Tip lies in the lower superior vena cava, well positioned. No pneumothorax. 2. No other change. Persistent cardiomegaly and vascular congestion without overt pulmonary edema.   Electronically Signed   By: Amie Portlandavid  Ormond M.D.   On: 02/20/2015 09:57    Scheduled Meds: . aspirin  81 mg Oral Daily  . ciprofloxacin  400 mg Intravenous Q24H  . furosemide  40 mg Intravenous BID  . insulin aspart  0-15 Units Subcutaneous TID WC  . insulin glargine  40 Units Subcutaneous QHS  . polyethylene glycol  17 g Oral Daily  . sodium chloride  10-40 mL Intracatheter Q12H  . sodium chloride  3 mL Intravenous Q12H   Continuous Infusions: . heparin 1,100 Units/hr (02/20/15 0905)        Time spent: 35 minutes    [facility] PsELMAHI,Shawn Dannenberg A  [facility] Pager [phone] If 7PM-7AM, please contact night-coverage at [email or web address], password [email or web address] 02/20/2015, 2:37 PM  LOS: 1 day

## 2015-02-20 NOTE — Evaluation (Signed)
Clinical/Bedside Swallow Evaluation Patient Details  Name: Cassidy Atkins MRN: [id number] Date of Birth: 05/30/1946  Today's Date: 02/20/2015 Time: SLP Start Time (ACUTE ONLY): 78290842 SLP Stop Time (ACUTE ONLY): 0914 SLP Time Calculation (min) (ACUTE ONLY): 32 min  Past Medical History:  Past Medical History  Diagnosis Date  . CKD (chronic kidney disease), stage III   . History of CVA (cerebrovascular accident)   . Paroxysmal atrial fibrillation   . HTN (hypertension)   . Hypercholesterolemia   . DM (diabetes mellitus), type 2 with renal complications   . Anasarca    Past Surgical History:  Past Surgical History  Procedure Laterality Date  . Right knee surgery     HPI:  69 y.o.African-American female past medical history of CVA 3 yrs ago, bedridden x 2 years, CKD, HTN, DM admitted with SOB amd swelling. Found to be in acute renal failure. No CXR performed.   Assessment / Plan / Recommendation Clinical Impression  Susptected penetration/aspiration due to decreased oral control, increased velocity with straw, possible reduced airway protection indicated by immediate cough following thin and increased respiratory effort. Symptoms not decreased with nectar consistency. Pt reported recommendation of esophageal dilation 4 years ago and pt did not follow up. Increased dyspnea increases risk. SLP recommending Dys 2 diet texture and thin liquids, no straws, pills whole in applesauce. ST will continue intervention.      Aspiration Risk  Moderate    Diet Recommendation Dysphagia 2 (Fine chop);Thin   Medication Administration: Whole meds with puree Compensations: Slow rate;Small sips/bites;Follow solids with liquid    Other  Recommendations Oral Care Recommendations: Oral care BID   Follow Up Recommendations       Frequency and Duration    2 weeks   Pertinent Vitals/Pain none         Swallow Study          Oral/Motor/Sensory Function Overall Oral Motor/Sensory Function:   (lingual thrust at rest, hypotonia tongue)   Ice Chips Ice chips: Not tested   Thin Liquid Thin Liquid: Impaired Presentation: Cup;Straw Oral Phase Impairments: Reduced labial seal Oral Phase Functional Implications: Right anterior spillage Pharyngeal  Phase Impairments: Cough - Immediate;Throat Clearing - Immediate;Throat Clearing - Delayed    Nectar Thick Nectar Thick Liquid: Impaired Presentation: Cup Pharyngeal Phase Impairments: Throat Clearing - Immediate;Throat Clearing - Delayed   Honey Thick Honey Thick Liquid: Not tested   Puree Puree: Within functional limits   Solid   GO    Solid: Impaired Oral Phase Impairments: Reduced lingual movement/coordination Oral Phase Functional Implications:  (prolonged transit)       [name], Breck CoonsLisa Willis 02/20/2015,9:32 AM   Breck CoonsLisa Willis Lonell FaceLitaker M.Ed [employer]-SLP Pager [phone]

## 2015-02-20 NOTE — Progress Notes (Signed)
ANTICOAGULATION CONSULT NOTE - Follow Up Consult  Pharmacy Consult for Heparin Indication: atrial fibrillation  Not on File  Patient Measurements: Height: 5\' 4"  (162.6 cm) Weight: 278 lb 8 oz (126.327 kg) IBW/kg (Calculated) : 54.7 Heparin Dosing Weight: 85.8 kg  Vital Signs: Temp: 98.5 F (36.9 C) (05/13 1656) Temp Source: Oral (05/13 1656) BP: 119/56 mmHg (05/13 1656) Pulse Rate: 59 (05/13 1656)  Labs:  Recent Labs  02/19/15 2010 02/20/15 0530 02/20/15 1922  HGB  --  10.2*  --   HCT  --  32.8*  --   PLT  --  188  --   LABPROT 18.6*  --   --   INR 1.53*  --   --   HEPARINUNFRC  --   --  0.63  CREATININE  --  3.69*  --     Estimated Creatinine Clearance: 18.7 mL/min (by C-G formula based on Cr of 3.69).   Medications:  Infusions:  . heparin 1,100 Units/hr (02/20/15 16100905)    Assessment: 69 year old female who was changed from Xarelto to IV heparin for atrial fibrillation. Heparin IV initiated this AM. Initial heparin level is therapeutic 0.63 however this may be affected by Xarelto (last dose given 10:22 AM on 5/12). APTT to confirm - is 43 (goal 66-102).  Due to recent Xarelto will go by APTT for now and follow both until coinciding.  Per RN, no issues with bleeding.   Goal of Therapy:  Heparin level 0.3-0.7 units/ml aPTT 66-102 seconds Monitor platelets by anticoagulation protocol: Yes   Plan:  Increase Heparin to 1400 units/hr.  Follow-up APTT/HL in 8 hours- ok to do with AM labs.  Daily APTT, heparin level and CBC.  Link SnufferJessica Arina Torry, PharmD, BCPS Clinical Pharmacist [phone] 02/20/2015,8:11 PM

## 2015-02-21 LAB — APTT: aPTT: 49 seconds — ABNORMAL HIGH (ref 24–37)

## 2015-02-21 LAB — CBC
HCT: 32.2 % — ABNORMAL LOW (ref 36.0–46.0)
HEMOGLOBIN: 10.2 g/dL — AB (ref 12.0–15.0)
MCH: 27.3 pg (ref 26.0–34.0)
MCHC: 31.7 g/dL (ref 30.0–36.0)
MCV: 86.1 fL (ref 78.0–100.0)
PLATELETS: 146 10*3/uL — AB (ref 150–400)
RBC: 3.74 MIL/uL — ABNORMAL LOW (ref 3.87–5.11)
RDW: 17.4 % — AB (ref 11.5–15.5)
WBC: 6.2 10*3/uL (ref 4.0–10.5)

## 2015-02-21 LAB — BASIC METABOLIC PANEL
ANION GAP: 11 (ref 5–15)
BUN: 80 mg/dL — AB (ref 6–20)
CHLORIDE: 105 mmol/L (ref 101–111)
CO2: 25 mmol/L (ref 22–32)
CREATININE: 2.65 mg/dL — AB (ref 0.44–1.00)
Calcium: 8.5 mg/dL — ABNORMAL LOW (ref 8.9–10.3)
GFR calc non Af Amer: 17 mL/min — ABNORMAL LOW (ref >60)
GFR, EST AFRICAN AMERICAN: 20 mL/min — AB (ref >60)
Glucose, Bld: 123 mg/dL — ABNORMAL HIGH (ref 65–99)
POTASSIUM: 4.5 mmol/L (ref 3.5–5.1)
Sodium: 141 mmol/L (ref 135–145)

## 2015-02-21 LAB — HEPATIC FUNCTION PANEL
ALT: 27 U/L (ref 14–54)
AST: 23 U/L (ref 15–41)
Albumin: 3 g/dL — ABNORMAL LOW (ref 3.5–5.0)
Alkaline Phosphatase: 48 U/L (ref 38–126)
BILIRUBIN DIRECT: 0.2 mg/dL (ref 0.1–0.5)
BILIRUBIN TOTAL: 0.7 mg/dL (ref 0.3–1.2)
Indirect Bilirubin: 0.5 mg/dL (ref 0.3–0.9)
Total Protein: 7.2 g/dL (ref 6.5–8.1)

## 2015-02-21 LAB — GLUCOSE, CAPILLARY
GLUCOSE-CAPILLARY: 121 mg/dL — AB (ref 65–99)
GLUCOSE-CAPILLARY: 104 mg/dL — AB (ref 65–99)
Glucose-Capillary: 95 mg/dL (ref 65–99)
Glucose-Capillary: 69 mg/dL (ref 65–99)
Glucose-Capillary: 87 mg/dL (ref 65–99)

## 2015-02-21 LAB — HEPARIN LEVEL (UNFRACTIONATED): Heparin Unfractionated: 0.54 IU/mL (ref 0.30–0.70)

## 2015-02-21 MED ORDER — RIVAROXABAN 15 MG PO TABS
15.0000 mg | ORAL_TABLET | Freq: Every day | ORAL | Status: DC
  Administered 2015-02-21 – 2015-02-23 (×3): 15 mg via ORAL
  Filled 2015-02-21 (×3): qty 1

## 2015-02-21 MED ORDER — FUROSEMIDE 40 MG PO TABS
40.0000 mg | ORAL_TABLET | Freq: Two times a day (BID) | ORAL | Status: DC
  Administered 2015-02-21 – 2015-02-23 (×5): 40 mg via ORAL
  Filled 2015-02-21 (×6): qty 1

## 2015-02-21 MED ORDER — INSULIN GLARGINE 100 UNIT/ML ~~LOC~~ SOLN
50.0000 [IU] | Freq: Every day | SUBCUTANEOUS | Status: DC
  Administered 2015-02-21: 50 [IU] via SUBCUTANEOUS
  Filled 2015-02-21 (×2): qty 0.5

## 2015-02-21 NOTE — Progress Notes (Signed)
[facility] PROGRESS NOTE   Cassidy AntonMary F Atkins BJY:[id number] DOB: 03/03/1946 DOA: 02/19/2015 PCP: Galvin ProfferHAGUE, IMRAN P, MD  HPI/Subjective: Seen while she was eating her breakfast, denies shortness of breath. Good urine output, creatinine is improving.  Assessment/Plan: Active Problems:   Acute renal failure   DM (diabetes mellitus), type 2 with renal complications   Anasarca   Morbid obesity   Paroxysmal atrial fibrillation   Benign essential HTN   Bedridden   History of CVA (cerebrovascular accident)   ARF (acute renal failure)   Acute renal failure Acute on chronic renal failure, baseline creatinine around 3.5. Presented with creatinine of 3.9. Reported to be oliguric and creatinine worsened to 4.3 after initiation of Lasix. Nephrology consulted, Lasix initiated at 80 mg IV twice a day, decrease to 40 today. Urine output of 3.7 L, creatinine improved to 2.6. Lasix decreased to 40 mg, continue diuresis and follow urine output, no plans for dialysis.  Diabetes mellitus type 2  Patient started on carbohydrate modified diet with renal restriction low potassium and fluid restriction. Hemoglobin A1c is 7.5. Restarted on insulin and only 4 units (it was reported she is on 100 units daily at bedtime) Fasting blood glucose 95, will decrease Lantus to 15 units, continue NovoLog 10 units with meals.  Paroxysmal atrial fibrillation CHA2DS2-VASc of at least 6 (age, sex, CVA, DM, HTN). Rate is controlled, EKG shows normal sinus rhythm Patient is on Xarelto, on hold, on heparin drip. Not sure if her creatinine will allow novel anticoagulants. Creatinine improved likely Xarelto will be okay to be restarted. Stop heparin start Xarelto.  History of CVA Her family patient has had CVA about 3 years ago, she's been in nursing home for the past 2 years She's been bedridden for the past 2 years, she has significant dysarthria.  Dysphagia Secondary to previous CVA, seen by SLP and recommended  dysphagia 2 with thin liquids.  Morbid obesity BMI is 47.9.  Code Status: Full Code Family Communication: Plan discussed with the patient. Disposition Plan: Remains inpatient Diet: DIET DYS 2 Room service appropriate?: Yes; Fluid consistency:: Thin; Fluid restriction:: 1200 mL Fluid  Consultants:  Nephrology  Procedures:  None  Antibiotics:  None   Objective: Filed Vitals:   02/21/15 0954  BP: 136/60  Pulse: 60  Temp: 98 F (36.7 C)  Resp: 21    Intake/Output Summary (Last 24 hours) at 02/21/15 1107 Last data filed at 02/21/15 0955  Gross per 24 hour  Intake      0 ml  Output   4675 ml  Net  -4675 ml   Filed Weights   02/19/15 1853 02/19/15 2119 02/20/15 2152  Weight: 126.327 kg (278 lb 8 oz) 126.327 kg (278 lb 8 oz) 125.8 kg (277 lb 5.4 oz)    Exam: General: Alert and awake, oriented x3, not in any acute distress. Morbidly obese African-American female HEENT: anicteric sclera, pupils reactive to light and accommodation, EOMI CVS: S1-S2 clear, no murmur rubs or gallops Chest: clear to auscultation bilaterally, no wheezing, rales or rhonchi Abdomen: soft nontender, nondistended, normal bowel sounds, no organomegaly Extremities: no cyanosis, clubbing or edema noted bilaterally Neuro: Cranial nerves II-XII intact, no focal neurological deficits  Data Reviewed: Basic Metabolic Panel:  Recent Labs Lab 02/20/15 0530 02/21/15 0446  NA 139 141  K 4.9 4.5  CL 101 105  CO2 26 25  GLUCOSE 132* 123*  BUN 87* 80*  CREATININE 3.69* 2.65*  CALCIUM 8.5* 8.5*   Liver Function Tests: No results  for input(s): AST, ALT, ALKPHOS, BILITOT, PROT, ALBUMIN in the last 168 hours. No results for input(s): LIPASE, AMYLASE in the last 168 hours. No results for input(s): AMMONIA in the last 168 hours. CBC:  Recent Labs Lab 02/20/15 0530 02/21/15 0446  WBC 7.0 6.2  HGB 10.2* 10.2*  HCT 32.8* 32.2*  MCV 86.3 86.1  PLT 188 146*   Cardiac Enzymes: No results for  input(s): CKTOTAL, CKMB, CKMBINDEX, TROPONINI in the last 168 hours. BNP (last 3 results) No results for input(s): BNP in the last 8760 hours.  ProBNP (last 3 results) No results for input(s): PROBNP in the last 8760 hours.  CBG:  Recent Labs Lab 02/20/15 0744 02/20/15 1203 02/20/15 1653 02/20/15 2131 02/21/15 0812  GLUCAP 113* 198* 179* 187* 95    Micro Recent Results (from the past 240 hour(s))  MRSA PCR Screening     Status: None   Collection Time: 02/19/15  7:01 PM  Result Value Ref Range Status   MRSA by PCR NEGATIVE NEGATIVE Final    Comment:        The GeneXpert MRSA Assay (FDA approved for NASAL specimens only), is one component of a comprehensive MRSA colonization surveillance program. It is not intended to diagnose MRSA infection nor to guide or monitor treatment for MRSA infections.      Studies: Dg Chest Port 1v Same Day  02/20/2015   CLINICAL DATA:  Followup pulmonary edema.  EXAM: PORTABLE CHEST - 1 VIEW SAME DAY  COMPARISON:  02/18/2015  FINDINGS: Moderate enlargement of the cardiopericardial silhouette stable. No mediastinal or hilar masses.  Vascular congestion persists without overt pulmonary edema. There is right lung base opacity likely atelectasis. Possible small right effusion. No areas of lung consolidation to suggest pneumonia. No pneumothorax.  Right internal jugular central venous line is new from the prior study. Tip lies in the lower superior vena cava near the caval atrial junction.  IMPRESSION: 1. New right internal jugular central venous line since the prior chest radiograph. Tip lies in the lower superior vena cava, well positioned. No pneumothorax. 2. No other change. Persistent cardiomegaly and vascular congestion without overt pulmonary edema.   Electronically Signed   By: Amie Portlandavid  Ormond M.D.   On: 02/20/2015 09:57    Scheduled Meds: . aspirin  81 mg Oral Daily  . ciprofloxacin  400 mg Intravenous Q24H  . furosemide  40 mg Intravenous  BID  . insulin aspart  0-15 Units Subcutaneous TID WC  . insulin aspart  10 Units Subcutaneous TID WC  . insulin glargine  60 Units Subcutaneous QHS  . polyethylene glycol  17 g Oral Daily  . sodium chloride  10-40 mL Intracatheter Q12H  . sodium chloride  3 mL Intravenous Q12H   Continuous Infusions: . heparin 1,600 Units/hr (02/21/15 [id number])       Time spent: 35 minutes    [facility] Surgery Center LLCELMAHI,Pinchos Topel A  [facility] Pager [phone] If 7PM-7AM, please contact night-coverage at [email or web address], password [email or web address] 02/21/2015, 11:07 AM  LOS: 2 days

## 2015-02-21 NOTE — Progress Notes (Signed)
Subjective:  Making urine and creatinine down Objective Vital signs in last 24 hours: Filed Vitals:   02/20/15 1656 02/20/15 2152 02/21/15 0500 02/21/15 0954  BP: 119/56 136/62 133/62 136/60  Pulse: 59 59 61 60  Temp: 98.5 F (36.9 C) 98.3 F (36.8 C) 97.8 F (36.6 C) 98 F (36.7 C)  TempSrc: Oral Oral Oral Oral  Resp: 20 20 20 21   Height:      Weight:  125.8 kg (277 lb 5.4 oz)    SpO2: 100% 100% 96% 98%   Weight change: -0.527 kg (-1 lb 2.6 oz)  Intake/Output Summary (Last 24 hours) at 02/21/15 1115 Last data filed at 02/21/15 0955  Gross per 24 hour  Intake      0 ml  Output   4675 ml  Net  -4675 ml    Assessment/ Plan: Pt is a 69 y.o. yo female - DM/HTN/obesity/ bedbound NH patient after CVA with aspiration precautions and aphasia who was admitted on 02/19/2015 with worsening volume status and AKI with UTI  Assessment/Plan: 1. Renal- creatinine noted to be 1.7   6 months ago.  Now with A on CRF in the setting of UTI and previous losartan.  Seemed to have  turned the corner with great UOP and decrease of creatinine.  No indication for dialysis.  I attempted to call family with good news but could not get a hold of anyone. Will take lasix down to PO dosing and would keep off of ARB as OP  2. UTI- send culture and give cipro  3. Anemia- hgb 10.2- supportive care for now  4. HTN/volume- overloaded- responded well to lasix-  Continue to titrate down now to PO dosing 5. Dispo- would look to transfer her back to NH when able  Renal will sign off- call with questions   Elanore Talcott A    Labs: Basic Metabolic Panel:  Recent Labs Lab 02/20/15 0530 02/21/15 0446  NA 139 141  K 4.9 4.5  CL 101 105  CO2 26 25  GLUCOSE 132* 123*  BUN 87* 80*  CREATININE 3.69* 2.65*  CALCIUM 8.5* 8.5*   Liver Function Tests: No results for input(s): AST, ALT, ALKPHOS, BILITOT, PROT, ALBUMIN in the last 168 hours. No results for input(s): LIPASE, AMYLASE in the last 168  hours. No results for input(s): AMMONIA in the last 168 hours. CBC:  Recent Labs Lab 02/20/15 0530 02/21/15 0446  WBC 7.0 6.2  HGB 10.2* 10.2*  HCT 32.8* 32.2*  MCV 86.3 86.1  PLT 188 146*   Cardiac Enzymes: No results for input(s): CKTOTAL, CKMB, CKMBINDEX, TROPONINI in the last 168 hours. CBG:  Recent Labs Lab 02/20/15 0744 02/20/15 1203 02/20/15 1653 02/20/15 2131 02/21/15 0812  GLUCAP 113* 198* 179* 187* 95    Iron Studies: No results for input(s): IRON, TIBC, TRANSFERRIN, FERRITIN in the last 72 hours. Studies/Results: Dg Chest Port 1v Same Day  02/20/2015   CLINICAL DATA:  Followup pulmonary edema.  EXAM: PORTABLE CHEST - 1 VIEW SAME DAY  COMPARISON:  02/18/2015  FINDINGS: Moderate enlargement of the cardiopericardial silhouette stable. No mediastinal or hilar masses.  Vascular congestion persists without overt pulmonary edema. There is right lung base opacity likely atelectasis. Possible small right effusion. No areas of lung consolidation to suggest pneumonia. No pneumothorax.  Right internal jugular central venous line is new from the prior study. Tip lies in the lower superior vena cava near the caval atrial junction.  IMPRESSION: 1. New right internal jugular central venous  line since the prior chest radiograph. Tip lies in the lower superior vena cava, well positioned. No pneumothorax. 2. No other change. Persistent cardiomegaly and vascular congestion without overt pulmonary edema.   Electronically Signed   By: Amie Portlandavid  Ormond M.D.   On: 02/20/2015 09:57   Medications: Infusions:    Scheduled Medications: . aspirin  81 mg Oral Daily  . ciprofloxacin  400 mg Intravenous Q24H  . furosemide  40 mg Intravenous BID  . insulin aspart  0-15 Units Subcutaneous TID WC  . insulin aspart  10 Units Subcutaneous TID WC  . insulin glargine  50 Units Subcutaneous QHS  . polyethylene glycol  17 g Oral Daily  . sodium chloride  10-40 mL Intracatheter Q12H  . sodium chloride  3  mL Intravenous Q12H    have reviewed scheduled and prn medications.  Physical Exam: General: obese- seems to understand what I am saying- aphasia- coughing Heart: RRR Lungs: poor effort, CBS Abdomen: obese, soft, non tender Extremities: pitting edema but better    02/21/2015,11:15 AM  LOS: 2 days

## 2015-02-21 NOTE — Progress Notes (Signed)
ANTICOAGULATION CONSULT NOTE - Follow Up Consult  Pharmacy Consult for Heparin  Indication: atrial fibrillation  Patient Measurements: Height: 5\' 4"  (162.6 cm) Weight: 277 lb 5.4 oz (125.8 kg) IBW/kg (Calculated) : 54.7  Vital Signs: Temp: 98.3 F (36.8 C) (05/13 2152) Temp Source: Oral (05/13 2152) BP: 136/62 mmHg (05/13 2152) Pulse Rate: 59 (05/13 2152)  Labs:  Recent Labs  02/19/15 2010 02/20/15 0530 02/20/15 1922 02/21/15 0446  HGB  --  10.2*  --  10.2*  HCT  --  32.8*  --  32.2*  PLT  --  188  --  146*  APTT  --   --  43* 49*  LABPROT 18.6*  --   --   --   INR 1.53*  --   --   --   HEPARINUNFRC  --   --  0.63 0.54  CREATININE  --  3.69*  --   --     Estimated Creatinine Clearance: 18.6 mL/min (by C-G formula based on Cr of 3.69).  Assessment: Sub-therapeutic aPTT, using aPTT to dose for now given Xarelto PTA, can likely switch to using heparin levels on 5/15, other labs as above.   Goal of Therapy:  Heparin level 0.3-0.7 units/ml aPTT 66-102 seconds Monitor platelets by anticoagulation protocol: Yes   Plan:  -Increase heparin to 1600 units/hr -1400 aPTT -Daily CBC/HL/aPTT -Monitor for bleeding  Abran DukeLedford, Ludene Stokke 02/21/2015,5:47 AM

## 2015-02-21 NOTE — Progress Notes (Signed)
Utilization review completed.  

## 2015-02-21 NOTE — Progress Notes (Signed)
ANTICOAGULATION CONSULT NOTE - Follow Up Consult  Pharmacy Consult for Xarelto Indication: atrial fibrillation  Not on File  Patient Measurements: Height: 5\' 4"  (162.6 cm) Weight: 277 lb 5.4 oz (125.8 kg) IBW/kg (Calculated) : 54.7  Vital Signs: Temp: 98 F (36.7 C) (05/14 0954) Temp Source: Oral (05/14 0954) BP: 136/60 mmHg (05/14 0954) Pulse Rate: 60 (05/14 0954)  Labs:  Recent Labs  02/19/15 2010 02/20/15 0530 02/20/15 1922 02/21/15 0446  HGB  --  10.2*  --  10.2*  HCT  --  32.8*  --  32.2*  PLT  --  188  --  146*  APTT  --   --  43* 49*  LABPROT 18.6*  --   --   --   INR 1.53*  --   --   --   HEPARINUNFRC  --   --  0.63 0.54  CREATININE  --  3.69*  --  2.65*    Estimated Creatinine Clearance: 25.9 mL/min (by C-G formula based on Cr of 2.65).   Medications:  Scheduled:  . aspirin  81 mg Oral Daily  . ciprofloxacin  400 mg Intravenous Q24H  . furosemide  40 mg Intravenous BID  . insulin aspart  0-15 Units Subcutaneous TID WC  . insulin aspart  10 Units Subcutaneous TID WC  . insulin glargine  50 Units Subcutaneous QHS  . polyethylene glycol  17 g Oral Daily  . sodium chloride  10-40 mL Intracatheter Q12H  . sodium chloride  3 mL Intravenous Q12H    Assessment: 69 yo f admitted on 5/12 for SOB. Patient has a history of afib on Xarelto PTA 15 mg once daily with evening meals. Patient has been receiving heparin [facility] and now is to transition back to Xarelto with resolving kidney function. Patient has CKD, baseline SCr ~3.5, SCr today 2.65. CrCl ~26 ml/min.   Goal of Therapy:  Monitor platelets by anticoagulation protocol: Yes   Plan:  Xarelto 15 mg once daily with evening meals Monitor CBC, renal function, need to change doses Pharmacy will sign off, please call with any questions  Thank you,   Jerelyn Trimarco L. Roseanne RenoStewart, PharmD Clinical Pharmacy Resident Pager: [phone] 02/21/2015 11:30 AM

## 2015-02-22 LAB — COMPREHENSIVE METABOLIC PANEL
ALK PHOS: 54 U/L (ref 38–126)
ALT: 22 U/L (ref 14–54)
AST: 24 U/L (ref 15–41)
Albumin: 3 g/dL — ABNORMAL LOW (ref 3.5–5.0)
Anion gap: 10 (ref 5–15)
BILIRUBIN TOTAL: 0.8 mg/dL (ref 0.3–1.2)
BUN: 55 mg/dL — AB (ref 6–20)
CHLORIDE: 107 mmol/L (ref 101–111)
CO2: 28 mmol/L (ref 22–32)
Calcium: 8.8 mg/dL — ABNORMAL LOW (ref 8.9–10.3)
Creatinine, Ser: 1.99 mg/dL — ABNORMAL HIGH (ref 0.44–1.00)
GFR calc Af Amer: 28 mL/min — ABNORMAL LOW (ref >60)
GFR calc non Af Amer: 24 mL/min — ABNORMAL LOW (ref >60)
Glucose, Bld: 81 mg/dL (ref 65–99)
Potassium: 4.2 mmol/L (ref 3.5–5.1)
Sodium: 145 mmol/L (ref 135–145)
TOTAL PROTEIN: 6.7 g/dL (ref 6.5–8.1)

## 2015-02-22 LAB — URINE CULTURE

## 2015-02-22 LAB — GLUCOSE, CAPILLARY
GLUCOSE-CAPILLARY: 76 mg/dL (ref 65–99)
Glucose-Capillary: 82 mg/dL (ref 65–99)
Glucose-Capillary: 82 mg/dL (ref 65–99)
Glucose-Capillary: 62 mg/dL — ABNORMAL LOW (ref 65–99)

## 2015-02-22 MED ORDER — INSULIN ASPART 100 UNIT/ML ~~LOC~~ SOLN
8.0000 [IU] | Freq: Three times a day (TID) | SUBCUTANEOUS | Status: DC
  Administered 2015-02-22: 8 [IU] via SUBCUTANEOUS

## 2015-02-22 MED ORDER — INSULIN GLARGINE 100 UNIT/ML ~~LOC~~ SOLN
40.0000 [IU] | Freq: Every day | SUBCUTANEOUS | Status: DC
  Administered 2015-02-22: 40 [IU] via SUBCUTANEOUS
  Filled 2015-02-22 (×2): qty 0.4

## 2015-02-22 NOTE — Progress Notes (Signed)
[facility] PROGRESS NOTE   Cassidy AntonMary F Atkins [id number] DOB: 07/11/1946 DOA: 02/19/2015 PCP: Galvin ProfferHAGUE, IMRAN P, MD  HPI/Subjective: Good urine output, creatinine improving steadily, better than reported baseline now at 1.99. For discharge in a.m. back to her nursing home.  Assessment/Plan: Active Problems:   Acute renal failure   DM (diabetes mellitus), type 2 with renal complications   Anasarca   Morbid obesity   Paroxysmal atrial fibrillation   Benign essential HTN   Bedridden   History of CVA (cerebrovascular accident)   ARF (acute renal failure)   Acute renal failure Acute on chronic renal failure, baseline creatinine around 3.5. Presented with creatinine of 3.9. Reported to be oliguric and creatinine worsened to 4.3 after initiation of Lasix. Nephrology consulted, Lasix initiated at 80 mg IV twice a day, decrease to 40. Again she had 3.67 L of urine output, doing very well denies any complaints. Continue Lasix at 40 mg, continue intake/output check.  Diabetes mellitus type 2  Patient started on carbohydrate modified diet with renal restriction low potassium and fluid restriction. Hemoglobin A1c is 7.5. Restarted on insulin and only 4 units (it was reported she is on 100 units daily at bedtime) Fasting blood sugar is 81 this morning, postprandial also in the low side.  I will decrease Lantus to 40 units and prandial coverage to 8 units  Paroxysmal atrial fibrillation CHA2DS2-VASc of at least 6 (age, sex, CVA, DM, HTN). Rate is controlled, EKG shows normal sinus rhythm Patient is on Xarelto, on hold, on heparin drip. Not sure if her creatinine will allow novel anticoagulants. Creatinine improved likely Xarelto will be okay to be restarted. Stop heparin start Xarelto.  History of CVA Her family patient has had CVA about 3 years ago, she's been in nursing home for the past 2 years She's been bedridden for the past 2 years, she has significant  dysarthria.  Dysphagia Secondary to previous CVA, seen by SLP and recommended dysphagia 2 with thin liquids.  Morbid obesity BMI is 47.9.  Code Status: Full Code Family Communication: Plan discussed with the patient. Disposition Plan: Remains inpatient Diet: DIET DYS 2 Room service appropriate?: Yes; Fluid consistency:: Thin; Fluid restriction:: 1200 mL Fluid  Consultants:  Nephrology  Procedures:  None  Antibiotics:  None   Objective: Filed Vitals:   02/22/15 1000  BP: 145/60  Pulse: 63  Temp: 98 F (36.7 C)  Resp: 20    Intake/Output Summary (Last 24 hours) at 02/22/15 1226 Last data filed at 02/22/15 1223  Gross per 24 hour  Intake   1112 ml  Output   3550 ml  Net  -2438 ml   Filed Weights   02/19/15 2119 02/20/15 2152 02/21/15 2047  Weight: 126.327 kg (278 lb 8 oz) 125.8 kg (277 lb 5.4 oz) 124.8 kg (275 lb 2.2 oz)    Exam: General: Alert and awake, oriented x3, not in any acute distress. Morbidly obese African-American female HEENT: anicteric sclera, pupils reactive to light and accommodation, EOMI CVS: S1-S2 clear, no murmur rubs or gallops Chest: clear to auscultation bilaterally, no wheezing, rales or rhonchi Abdomen: soft nontender, nondistended, normal bowel sounds, no organomegaly Extremities: no cyanosis, clubbing or edema noted bilaterally Neuro: Cranial nerves II-XII intact, no focal neurological deficits  Data Reviewed: Basic Metabolic Panel:  Recent Labs Lab 02/20/15 0530 02/21/15 0446 02/22/15 0657  NA 139 141 145  K 4.9 4.5 4.2  CL 101 105 107  CO2 26 25 28   GLUCOSE 132* 123* 81  BUN  87* 80* 55*  CREATININE 3.69* 2.65* 1.99*  CALCIUM 8.5* 8.5* 8.8*   Liver Function Tests:  Recent Labs Lab 02/21/15 0950 02/22/15 0657  AST 23 24  ALT 27 22  ALKPHOS 48 54  BILITOT 0.7 0.8  PROT 7.2 6.7  ALBUMIN 3.0* 3.0*   No results for input(s): LIPASE, AMYLASE in the last 168 hours. No results for input(s): AMMONIA in the last  168 hours. CBC:  Recent Labs Lab 02/20/15 0530 02/21/15 0446  WBC 7.0 6.2  HGB 10.2* 10.2*  HCT 32.8* 32.2*  MCV 86.3 86.1  PLT 188 146*   Cardiac Enzymes: No results for input(s): CKTOTAL, CKMB, CKMBINDEX, TROPONINI in the last 168 hours. BNP (last 3 results) No results for input(s): BNP in the last 8760 hours.  ProBNP (last 3 results) No results for input(s): PROBNP in the last 8760 hours.  CBG:  Recent Labs Lab 02/21/15 1643 02/21/15 1747 02/21/15 2045 02/22/15 0755 02/22/15 1157  GLUCAP 69 87 104* 76 82    Micro Recent Results (from the past 240 hour(s))  MRSA PCR Screening     Status: None   Collection Time: 02/19/15  7:01 PM  Result Value Ref Range Status   MRSA by PCR NEGATIVE NEGATIVE Final    Comment:        The GeneXpert MRSA Assay (FDA approved for NASAL specimens only), is one component of a comprehensive MRSA colonization surveillance program. It is not intended to diagnose MRSA infection nor to guide or monitor treatment for MRSA infections.   Urine culture     Status: None (Preliminary result)   Collection Time: 02/20/15  2:40 PM  Result Value Ref Range Status   Specimen Description URINE, RANDOM  Final   Special Requests NONE  Final   Colony Count   Final    25,000 COLONIES/ML Performed at [facility]    Culture   Final    ESCHERICHIA COLI Performed at [facility]    Report Status PENDING  Incomplete     Studies: No results found.  Scheduled Meds: . aspirin  81 mg Oral Daily  . ciprofloxacin  400 mg Intravenous Q24H  . furosemide  40 mg Oral BID  . insulin aspart  0-15 Units Subcutaneous TID WC  . insulin aspart  10 Units Subcutaneous TID WC  . insulin glargine  50 Units Subcutaneous QHS  . polyethylene glycol  17 g Oral Daily  . rivaroxaban  15 mg Oral Q supper  . sodium chloride  10-40 mL Intracatheter Q12H  . sodium chloride  3 mL Intravenous Q12H   Continuous Infusions:       Time spent: 35  minutes    [facility]MA[facility],Deandrea Vanpelt A  [facility] Pager [phone] If 7PM-7AM, please contact night-coverage at [email or web address], password [email or web address] 02/22/2015, 12:26 PM  LOS: 3 days

## 2015-02-22 NOTE — Clinical Social Work Note (Signed)
Clinical Social Work Assessment  Patient Details  Name: Cassidy Atkins MRN: [id number] Date of Birth: 05/03/1946  Date of referral:  02/22/15               Reason for consult:  Facility Placement                Permission sought to share information with:  Family Supports Permission granted to share information::  Yes, Verbal Permission Granted  Name::     ArboriculturistWanda  Agency::     Relationship::  daughter  Contact Information:     Housing/Transportation Living arrangements for the past 2 months:  Skilled Building surveyorursing Facility Source of Information:  Patient, Adult Children Patient Interpreter Needed:  None Criminal Activity/Legal Involvement Pertinent to Current Situation/Hospitalization:    Significant Relationships:  Adult Children Lives with:  Facility Resident Do you feel safe going back to the place where you live?  Yes Need for family participation in patient care:  Yes (Comment)  Care giving concerns:  Patient has [facility] for past 2 years- family is interested in transitioning patient to different facility.   Social Worker assessment / plan:  CSW spoke with pt and family (son and daughter) concerning patient return to [facility] where pt has been long term resident.  Patient and family would like for patient to switch facilities- family is interested in [facility] and patient would like [facility].  CSW explained that long term beds are not always available due to low turnover- family expresses understanding that the pt might need to go back to [facility] and pursue alternate placement from there.  Employment status:  Retired Health and safety inspectornsurance information:  Armed forces operational officerMedicare, [place] Recommendations:  Not assessed at this time Information / Referral to community resources:  Skilled Nursing Facility  Patient/Family's Response to care:  Patients family is agreeable to changing SNF and is hopeful that CSW involvement will help make the process  quicker.  Patient/Family's Understanding of and Emotional Response to Diagnosis, Current Treatment, and Prognosis:  Unclear level of understanding- family main concern is having the patient nearby family  Emotional Assessment Appearance:  Appears stated age Attitude/Demeanor/Rapport:    Affect (typically observed):  Quiet, Frustrated Orientation:  Oriented to Self, Oriented to Place, Oriented to  Time, Oriented to Situation (currently Ox4 but has had bouts of confusion) Alcohol / Substance use:  Not Applicable Psych involvement (Current and /or in the community):  No (Comment)  Discharge Needs  Concerns to be addressed:  Discharge Planning Concerns Readmission within the last 30 days:  No Current discharge risk:  None Barriers to Discharge:  Continued Medical Work up   [employer], Vivia Rosenburg M, LCSW 02/22/2015, 3:56 PM

## 2015-02-22 NOTE — Clinical Social Work Placement (Signed)
   CLINICAL SOCIAL WORK PLACEMENT  NOTE  Date:  02/22/2015  Patient Details  Name: Ardelle AntonMary F Beitler MRN: [id number] Date of Birth: 10/17/1945  Clinical Social Work is seeking post-discharge placement for this patient at the Skilled  Nursing Facility level of care (*CSW will initial, date and re-position this form in  chart as items are completed):  Yes   Patient/family provided with [facility] Clinical Social Work Department's list of facilities offering this level of care within the geographic area requested by the patient (or if unable, by the patient's family).  Yes   Patient/family informed of their freedom to choose among providers that offer the needed level of care, that participate in Medicare, Medicaid or managed care program needed by the patient, have an available bed and are willing to accept the patient.  Yes   Patient/family informed of [facility]'s ownership interest in [facility] and [facility], as well as of the fact that they are under no obligation to receive care at these facilities.  PASRR submitted to EDS on       PASRR number received on       Existing PASRR number confirmed on 02/22/15     FL2 transmitted to all facilities in geographic area requested by pt/family on 02/22/15     FL2 transmitted to all facilities within larger geographic area on       Patient informed that his/her managed care company has contracts with or will negotiate with certain facilities, including the following:            Patient/family informed of bed offers received.  Patient chooses bed at       Physician recommends and patient chooses bed at      Patient to be transferred to   on  .  Patient to be transferred to facility by       Patient family notified on   of transfer.  Name of family member notified:        PHYSICIAN Please sign FL2     Additional Comment:    _______________________________________________ Izora RibasHoloman, Caliann Leckrone M, LCSW 02/22/2015, 4:00  PM

## 2015-02-23 LAB — COMPREHENSIVE METABOLIC PANEL
ALK PHOS: 54 U/L (ref 38–126)
ALT: 21 U/L (ref 14–54)
ANION GAP: 7 (ref 5–15)
AST: 20 U/L (ref 15–41)
Albumin: 3.1 g/dL — ABNORMAL LOW (ref 3.5–5.0)
BUN: 42 mg/dL — AB (ref 6–20)
CO2: 32 mmol/L (ref 22–32)
Calcium: 9 mg/dL (ref 8.9–10.3)
Chloride: 107 mmol/L (ref 101–111)
Creatinine, Ser: 1.76 mg/dL — ABNORMAL HIGH (ref 0.44–1.00)
GFR calc non Af Amer: 28 mL/min — ABNORMAL LOW (ref >60)
GFR, EST AFRICAN AMERICAN: 33 mL/min — AB (ref >60)
GLUCOSE: 87 mg/dL (ref 65–99)
Potassium: 4.3 mmol/L (ref 3.5–5.1)
Sodium: 146 mmol/L — ABNORMAL HIGH (ref 135–145)
Total Bilirubin: 0.7 mg/dL (ref 0.3–1.2)
Total Protein: 7 g/dL (ref 6.5–8.1)

## 2015-02-23 LAB — GLUCOSE, CAPILLARY
GLUCOSE-CAPILLARY: 99 mg/dL (ref 65–99)
Glucose-Capillary: 89 mg/dL (ref 65–99)
Glucose-Capillary: 113 mg/dL — ABNORMAL HIGH (ref 65–99)
Glucose-Capillary: 109 mg/dL — ABNORMAL HIGH (ref 65–99)

## 2015-02-23 LAB — CBC
HCT: 32.8 % — ABNORMAL LOW (ref 36.0–46.0)
Hemoglobin: 10.3 g/dL — ABNORMAL LOW (ref 12.0–15.0)
MCH: 27.2 pg (ref 26.0–34.0)
MCHC: 31.4 g/dL (ref 30.0–36.0)
MCV: 86.8 fL (ref 78.0–100.0)
PLATELETS: 169 10*3/uL (ref 150–400)
RBC: 3.78 MIL/uL — AB (ref 3.87–5.11)
RDW: 17 % — ABNORMAL HIGH (ref 11.5–15.5)
WBC: 6 10*3/uL (ref 4.0–10.5)

## 2015-02-23 MED ORDER — CIPROFLOXACIN HCL 500 MG PO TABS: 500.0000 mg | ORAL_TABLET | Freq: Two times a day (BID) | ORAL | Status: DC

## 2015-02-23 MED ORDER — LANTUS SOLOSTAR 100 UNIT/ML ~~LOC~~ SOPN: 35.0000 [IU] | PEN_INJECTOR | Freq: Every day | SUBCUTANEOUS | Status: DC

## 2015-02-23 MED ORDER — GABAPENTIN 300 MG PO CAPS: 300.0000 mg | ORAL_CAPSULE | Freq: Two times a day (BID) | ORAL | Status: AC

## 2015-02-23 MED ORDER — HUMALOG KWIKPEN 100 UNIT/ML ~~LOC~~ SOPN: 8.0000 [IU] | PEN_INJECTOR | Freq: Three times a day (TID) | SUBCUTANEOUS | Status: AC

## 2015-02-23 MED ORDER — FUROSEMIDE 80 MG PO TABS: 80.0000 mg | ORAL_TABLET | Freq: Two times a day (BID) | ORAL | Status: AC

## 2015-02-23 NOTE — Discharge Summary (Signed)
Physician Discharge Summary  Cassidy AntonMary F Atkins ZOX:[id number] DOB: 01/07/1946 DOA: 02/19/2015  PCP: Galvin ProfferHAGUE, IMRAN P, MD  Admit date: 02/19/2015 Discharge date: 02/23/2015  Time spent: 40 minutes  Recommendations for Outpatient Follow-up:  1. Follow-up with the nursing home M.D., check CBC and BMP within 3-4 days. 2. Lantus switched from 100 unit down to 35 units, continue NovoLog 8 units with meals.  Discharge Diagnoses:  Active Problems:   Acute renal failure   DM (diabetes mellitus), type 2 with renal complications   Anasarca   Morbid obesity   Paroxysmal atrial fibrillation   Benign essential HTN   Bedridden   History of CVA (cerebrovascular accident)   ARF (acute renal failure)   Discharge Condition: Stable  Diet recommendation: Dysphagia 1 with thin liquids  Filed Weights   02/19/15 2119 02/20/15 2152 02/21/15 2047  Weight: 126.327 kg (278 lb 8 oz) 125.8 kg (277 lb 5.4 oz) 124.8 kg (275 lb 2.2 oz)    History of present illness:  Cassidy AntonMary F Atkins is a 69 y.o.African-American female past medical history of CVA, patient is bedridden since about 2 years ago, [facility] [place]. Patient does have CKD stage III with baseline creatinine about 3.5, paroxysmal atrial fibrillation on Xarelto and hypertension. Brought to the hospital because of shortness of breath and swelling. Patient mentioned for the past 2 weeks prior to admission she started to have more swelling in her legs, developed more shortness of breath so the nursing staff send [facility] evaluation. She was seen by Dr. With one of the [facility]rolog[facility] and he recommended for [facility]be [facility].  Hospital Course:   Acute renal failure Acute kidney injury on chronic kidney disease stage 3-4 baseline creatinine around 3.5. Presented with creatinine of 3.9. Reported to be oliguric and creatinine  worsened to 4.3 after initiation of Lasix. Nephrology consulted, Lasix initiated at 80 mg IV twice a day, had good urine output. During the hospital stay were successful of removing 9.6 L of fluids as net negative balance. Lasix decreased to 40 mg IV twice a day on discharge switched to 80 mg twice a day. Follow renal function closely, might need further adjustment on Lasix dosing.  Diabetes mellitus type 2  Patient started on carbohydrate modified diet with renal restriction (low potassium and fluid restriction). Hemoglobin A1c is 7.5. Restarted on insulin and only 4 units (it was reported she is on 100 units daily at bedtime) Patient developed hypoglycemia on 40 and 50 units of Lantus so decrease further to 35 units at the time of discharge. Meal coverage continued at 8 units of NovoLog.  Paroxysmal atrial fibrillation CHA2DS2-VASc of at least 6 (age, sex, CVA, DM, HTN).  Rate is controlled, EKG shows normal sinus rhythm Abdomen exam of admission her Noel ChristmasXarleto was held and started on heparin drip. As creatinine improved, Xarelto restarted.  History of CVA Her family patient has had CVA about 3 years ago, she's been in nursing home for the past 2 years She's been bedridden for the past 2 years, she has resultant dysarthria.  Dysphagia Secondary to previous CVA, seen by SLP and recommended dysphagia 1 with thin liquids  Morbid obesity BMI is 47.9.   Procedures:  None  Consultations:  Nephrology  Discharge Exam: Filed Vitals:   02/23/15 0949  BP: 152/69  Pulse: 68  Temp: 99.2 F (37.3 C)  Resp: 15   General: Alert  and awake, oriented x3, not in any acute distress. HEENT: anicteric sclera, pupils reactive to light and accommodation, EOMI CVS: S1-S2 clear, no murmur rubs or gallops Chest: clear to auscultation bilaterally, no wheezing, rales or rhonchi Abdomen: soft nontender, nondistended, normal bowel sounds, no organomegaly Extremities: no cyanosis, clubbing or edema  noted bilaterally Neuro: Cranial nerves II-XII intact, no focal neurological deficits  Discharge Instructions   Discharge Instructions    Diet - low sodium heart healthy    Complete by:  As directed      Increase activity slowly    Complete by:  As directed           Current Discharge Medication List    START taking these medications   Details  ciprofloxacin (CIPRO) 500 MG tablet Take 1 tablet (500 mg total) by mouth 2 (two) times daily. Qty: 6 tablet, Refills: 0      CONTINUE these medications which have CHANGED   Details  furosemide (LASIX) 80 MG tablet Take 1 tablet (80 mg total) by mouth 2 (two) times daily. Qty: 60 tablet, Refills: 0    gabapentin (NEURONTIN) 300 MG capsule Take 1 capsule (300 mg total) by mouth 2 (two) times daily.    HUMALOG KWIKPEN 100 UNIT/ML KiwkPen Inject 0.08 mLs (8 Units total) into the skin 3 (three) times daily with meals. Qty: 15 mL, Refills: 11    LANTUS SOLOSTAR 100 UNIT/ML Solostar Pen Inject 35 Units into the skin daily at 10 pm.      CONTINUE these medications which have NOT CHANGED   Details  acetaminophen (TYLENOL) 325 MG tablet Take 650 mg by mouth every 6 (six) hours as needed for mild pain.    ADVAIR DISKUS 250-50 MCG/DOSE AEPB Inhale 1 puff into the lungs daily.     aspirin EC 81 MG tablet Take 81 mg by mouth daily.    atorvastatin (LIPITOR) 10 MG tablet Take 10 mg by mouth daily.    ciprofloxacin (CILOXAN) 0.3 % ophthalmic solution Place 2 drops into both eyes every 4 (four) hours while awake.     guaiFENesin (MUCINEX) 600 MG 12 hr tablet Take 600 mg by mouth 2 (two) times daily as needed for cough.    guaifenesin (ROBITUSSIN) 100 MG/5ML syrup Take 200 mg by mouth every 6 (six) hours as needed for cough.    hydrALAZINE (APRESOLINE) 25 MG tablet Take 37.5 mg by mouth 3 (three) times daily.     ipratropium-albuterol (DUONEB) 0.5-2.5 (3) MG/3ML SOLN Inhale 3 mLs into the lungs every 2 (two) hours as needed (wheezing).      loratadine (CLARITIN) 10 MG tablet Take 10 mg by mouth daily.    magnesium hydroxide (MILK OF MAGNESIA) 400 MG/5ML suspension Take 30 mLs by mouth daily as needed for mild constipation.    metoprolol (LOPRESSOR) 50 MG tablet Take 50 mg by mouth 2 (two) times daily.    mometasone (NASONEX) 50 MCG/ACT nasal spray Place 2 sprays into the nose daily.     Multiple Vitamins-Iron (MULTIVITAMINS WITH IRON) TABS tablet Take 1 tablet by mouth daily.    oxyCODONE (OXY IR/ROXICODONE) 5 MG immediate release tablet Take 5 mg by mouth every 4 (four) hours as needed for moderate pain.     promethazine (PHENERGAN) 25 MG tablet Take 25 mg by mouth every 4 (four) hours as needed for nausea.     senna (SENOKOT) 8.6 MG TABS tablet Take 2 tablets by mouth at bedtime.    SPIRIVA HANDIHALER 18 MCG inhalation  capsule Place 18 mcg into inhaler and inhale daily.     XARELTO 15 MG TABS tablet Take 15 mg by mouth daily.      STOP taking these medications     amLODipine (NORVASC) 2.5 MG tablet      Hydrocodone-Chlorpheniramine 5-4 MG/5ML SOLN      Loratadine 10 MG CAPS      losartan (COZAAR) 50 MG tablet        Not on File Follow-up Information    Follow up with HAGUE, Myrene Galas, MD In 1 week.   Specialty:  Internal Medicine   Contact information:   [address] [place] [address] [phone]        The results of significant diagnostics from this hospitalization (including imaging, microbiology, ancillary and laboratory) are listed below for reference.    Significant Diagnostic Studies: Dg Chest Port 1v Same Day  02/20/2015   CLINICAL DATA:  Followup pulmonary edema.  EXAM: PORTABLE CHEST - 1 VIEW SAME DAY  COMPARISON:  02/18/2015  FINDINGS: Moderate enlargement of the cardiopericardial silhouette stable. No mediastinal or hilar masses.  Vascular congestion persists without overt pulmonary edema. There is right lung base opacity likely atelectasis. Possible small right effusion. No  areas of lung consolidation to suggest pneumonia. No pneumothorax.  Right internal jugular central venous line is new from the prior study. Tip lies in the lower superior vena cava near the caval atrial junction.  IMPRESSION: 1. New right internal jugular central venous line since the prior chest radiograph. Tip lies in the lower superior vena cava, well positioned. No pneumothorax. 2. No other change. Persistent cardiomegaly and vascular congestion without overt pulmonary edema.   Electronically Signed   By: Amie Portland M.D.   On: 02/20/2015 09:57    Microbiology: Recent Results (from the past 240 hour(s))  MRSA PCR Screening     Status: None   Collection Time: 02/19/15  7:01 PM  Result Value Ref Range Status   MRSA by PCR NEGATIVE NEGATIVE Final    Comment:        The GeneXpert MRSA Assay (FDA approved for NASAL specimens only), is one component of a comprehensive MRSA colonization surveillance program. It is not intended to diagnose MRSA infection nor to guide or monitor treatment for MRSA infections.   Urine culture     Status: None   Collection Time: 02/20/15  2:40 PM  Result Value Ref Range Status   Specimen Description URINE, RANDOM  Final   Special Requests NONE  Final   Colony Count   Final    25,000 COLONIES/ML Performed at [facility]    Culture   Final    ESCHERICHIA COLI Performed at [facility]    Report Status 02/22/2015 FINAL  Final   Organism ID, Bacteria ESCHERICHIA COLI  Final      Susceptibility   Escherichia coli - MIC*    AMPICILLIN 4 SENSITIVE Sensitive     CEFAZOLIN <=4 SENSITIVE Sensitive     CEFTRIAXONE <=1 SENSITIVE Sensitive     CIPROFLOXACIN <=0.25 SENSITIVE Sensitive     GENTAMICIN <=1 SENSITIVE Sensitive     LEVOFLOXACIN <=0.12 SENSITIVE Sensitive     NITROFURANTOIN <=16 SENSITIVE Sensitive     TOBRAMYCIN <=1 SENSITIVE Sensitive     TRIMETH/SULFA <=20 SENSITIVE Sensitive     PIP/TAZO <=4 SENSITIVE Sensitive     *  ESCHERICHIA COLI     Labs: Basic Metabolic Panel:  Recent Labs Lab 02/20/15 0530 02/21/15 0446  02/22/15 0657 02/23/15 0450  NA 139 141 145 146*  K 4.9 4.5 4.2 4.3  CL 101 105 107 107  CO2 32  GLUCOSE 132* 123* 81 87  BUN 87* 80* 55* 42*  CREATININE 3.69* 2.65* 1.99* 1.76*  CALCIUM 8.5* 8.5* 8.8* 9.0   Liver Function Tests:  Recent Labs Lab 02/21/15 0950 02/22/15 0657 02/23/15 0450  AST ALT ALKPHOS 48 54 54  BILITOT 0.7 0.8 0.7  PROT 7.2 6.7 7.0  ALBUMIN 3.0* 3.0* 3.1*   No results for input(s): LIPASE, AMYLASE in the last 168 hours. No results for input(s): AMMONIA in the last 168 hours. CBC:  Recent Labs Lab 02/20/15 0530 02/21/15 0446 02/23/15 0450  WBC 7.0 6.2 6.0  HGB 10.2* 10.2* 10.3*  HCT 32.8* 32.2* 32.8*  MCV 86.3 86.1 86.8  PLT 188 146* 169   Cardiac Enzymes: No results for input(s): CKTOTAL, CKMB, CKMBINDEX, TROPONINI in the last 168 hours. BNP: BNP (last 3 results) No results for input(s): BNP in the last 8760 hours.  ProBNP (last 3 results) No results for input(s): PROBNP in the last 8760 hours.  CBG:  Recent Labs Lab 02/22/15 1157 02/22/15 1634 02/22/15 2209 02/22/15 2240 02/23/15 0750  GLUCAP 82 82 62* 99 89       Signed:  Shalen Petrak A  [facility] 02/23/2015, 11:19 AM

## 2015-02-23 NOTE — Progress Notes (Signed)
Speech Language Pathology Treatment: Dysphagia  Patient Details Name: ILHAM ROUGHTON MRN: [id number] DOB: 1946-03-25 Today's Date: 02/23/2015 Time: 6861-6837 SLP Time Calculation (min) (ACUTE ONLY): 25 min  Assessment / Plan / Recommendation Clinical Impression  Pt seen with am meal. Observed to struggle with oral transit of dys 2 solids, several liquid washed and verbal cues needed to transit one egg bolus due to lingual pumping and lack of lateral movement of tongue to clear buccal cavity. Pt tolerating small sips of thin liquids well with one small cough with a larger sip. Recommend pt downgrade to dys 1 (puree) solid with thin liquids and ongoing precautions. No further acute f/u needed.    HPI Other Pertinent Information: 69 y.o.African-American female past medical history of CVA 3 yrs ago, bedridden x 2 years, CKD, HTN, DM admitted with SOB amd swelling. Found to be in acute renal failure. No CXR performed.   Pertinent Vitals    SLP Plan  All goals met    Recommendations Diet recommendations: Dysphagia 1 (puree);Thin liquid Liquids provided via: Cup;No straw Medication Administration: Whole meds with puree Supervision: Staff to assist with self feeding Compensations: Slow rate;Small sips/bites;Follow solids with liquid Postural Changes and/or Swallow Maneuvers: Seated upright 90 degrees;Upright 30-60 min after meal              Oral Care Recommendations: Oral care BID Follow up Recommendations: Skilled Nursing facility Plan: All goals met    GO    Herbie Baltimore, MA CCC-SLP [phone]  Lynann Beaver 02/23/2015, 8:49 AM

## 2015-02-23 NOTE — Care Management Note (Signed)
Case Management Note  Patient Details  Name: Cassidy Atkins MRN: [id number] Date of Birth: 1946-04-24  Subjective/Objective:                 CM following for progression and d/c planning.   Action/Plan: 02/23/2015 Met with pt and some family member, pt states that her plan is to return to SNF.   Expected Discharge Date:       02/23/15           Expected Discharge Plan:  Skilled Nursing Facility  In-House Referral:  Clinical Social Work  Discharge planning Services  NA  Post Acute Care Choice:  NA Choice offered to:  NA  DME Arranged:    DME Agency:     HH Arranged:    [place] Agency:     Status of Service:  Completed, signed off  Medicare Important Message Given:  Yes Date Medicare IM Given:  02/23/15 Medicare IM give by:  Jasmine Pang RN MPH, case manager, [phone] Date Additional Medicare IM Given:    Additional Medicare Important Message give by:     If discussed at [place] of Stay Meetings, dates discussed:    Additional Comments:  Adron Bene, RN 02/23/2015, 12:03 PM

## 2015-02-23 NOTE — Clinical Social Work Placement (Signed)
   CLINICAL SOCIAL WORK PLACEMENT  NOTE  Date:  02/23/2015  Patient Details  Name: Cassidy AntonMary F Hackbart MRN: [id number] Date of Birth: 02/22/1946  Clinical Social Work is seeking post-discharge placement for this patient at the Skilled  Nursing Facility level of care (*CSW will initial, date and re-position this form in  chart as items are completed):  Yes   Patient/family provided with [place] Clinical Social Work Department's list of facilities offering this level of care within the geographic area requested by the patient (or if unable, by the patient's family).  Yes   Patient/family informed of their freedom to choose among providers that offer the needed level of care, that participate in Medicare, Medicaid or managed care program needed by the patient, have an available bed and are willing to accept the patient.  Yes   Patient/family informed of [place]'s ownership interest in [facility] and [facility], as well as of the fact that they are under no obligation to receive care at these facilities.  PASRR submitted to EDS on  in 2013     PASRR number received on  in 2013     Existing PASRR number confirmed on 02/22/15     FL2 transmitted to all facilities in geographic area requested by pt/family on 02/22/15     FL2 transmitted to all facilities within larger geographic area on       Patient informed that his/her managed care company has contracts with or will negotiate with certain facilities, including the following:          02/23/15  Patient/family informed of bed offers received.  [facility]     Physician recommends and patient chooses bed at      [facility].  Patient to be transferred to facility by  ambulance     Patient family notified on  02/23/15 of transfer.  Name of family member notified:   Daughter Ester RinkWanda Hardy ([phone])     PHYSICIAN Please sign FL2      Additional Comment:    _______________________________________________ Cristobal Goldmannrawford, [place], LCSW 02/23/2015, 6:02 PM

## 2015-02-24 NOTE — Progress Notes (Signed)
Name of family member notified:  Daughter Ester RinkWanda Hardy ([phone]) / called and informed  Transport ers  Here  For transport to facility.

## 2016-05-12 DIAGNOSIS — I509 Heart failure, unspecified: Secondary | ICD-10-CM

## 2016-05-12 DIAGNOSIS — R601 Generalized edema: Secondary | ICD-10-CM

## 2016-05-12 DIAGNOSIS — E162 Hypoglycemia, unspecified: Secondary | ICD-10-CM

## 2016-05-12 DIAGNOSIS — R748 Abnormal levels of other serum enzymes: Secondary | ICD-10-CM

## 2016-05-12 DIAGNOSIS — N289 Disorder of kidney and ureter, unspecified: Secondary | ICD-10-CM

## 2016-05-12 DIAGNOSIS — N189 Chronic kidney disease, unspecified: Secondary | ICD-10-CM

## 2016-05-12 DIAGNOSIS — N179 Acute kidney failure, unspecified: Secondary | ICD-10-CM

## 2016-05-13 DIAGNOSIS — N189 Chronic kidney disease, unspecified: Secondary | ICD-10-CM | POA: Diagnosis not present

## 2016-05-13 DIAGNOSIS — N179 Acute kidney failure, unspecified: Secondary | ICD-10-CM | POA: Diagnosis not present

## 2016-05-13 DIAGNOSIS — R748 Abnormal levels of other serum enzymes: Secondary | ICD-10-CM | POA: Diagnosis not present

## 2016-05-13 DIAGNOSIS — E162 Hypoglycemia, unspecified: Secondary | ICD-10-CM | POA: Diagnosis not present

## 2016-05-14 DIAGNOSIS — N189 Chronic kidney disease, unspecified: Secondary | ICD-10-CM | POA: Diagnosis not present

## 2016-05-14 DIAGNOSIS — R748 Abnormal levels of other serum enzymes: Secondary | ICD-10-CM | POA: Diagnosis not present

## 2016-05-14 DIAGNOSIS — E162 Hypoglycemia, unspecified: Secondary | ICD-10-CM | POA: Diagnosis not present

## 2016-05-14 DIAGNOSIS — N179 Acute kidney failure, unspecified: Secondary | ICD-10-CM | POA: Diagnosis not present

## 2016-05-15 DIAGNOSIS — N179 Acute kidney failure, unspecified: Secondary | ICD-10-CM | POA: Diagnosis not present

## 2016-05-15 DIAGNOSIS — R748 Abnormal levels of other serum enzymes: Secondary | ICD-10-CM | POA: Diagnosis not present

## 2016-05-15 DIAGNOSIS — E162 Hypoglycemia, unspecified: Secondary | ICD-10-CM | POA: Diagnosis not present

## 2016-05-15 DIAGNOSIS — N189 Chronic kidney disease, unspecified: Secondary | ICD-10-CM | POA: Diagnosis not present

## 2016-05-16 DIAGNOSIS — N179 Acute kidney failure, unspecified: Secondary | ICD-10-CM | POA: Diagnosis not present

## 2016-05-16 DIAGNOSIS — R748 Abnormal levels of other serum enzymes: Secondary | ICD-10-CM | POA: Diagnosis not present

## 2016-05-16 DIAGNOSIS — N189 Chronic kidney disease, unspecified: Secondary | ICD-10-CM | POA: Diagnosis not present

## 2016-05-16 DIAGNOSIS — E162 Hypoglycemia, unspecified: Secondary | ICD-10-CM | POA: Diagnosis not present

## 2016-05-17 DIAGNOSIS — N189 Chronic kidney disease, unspecified: Secondary | ICD-10-CM | POA: Diagnosis not present

## 2016-05-17 DIAGNOSIS — E162 Hypoglycemia, unspecified: Secondary | ICD-10-CM | POA: Diagnosis not present

## 2016-05-17 DIAGNOSIS — N179 Acute kidney failure, unspecified: Secondary | ICD-10-CM | POA: Diagnosis not present

## 2016-05-17 DIAGNOSIS — R748 Abnormal levels of other serum enzymes: Secondary | ICD-10-CM | POA: Diagnosis not present

## 2016-05-18 DIAGNOSIS — N289 Disorder of kidney and ureter, unspecified: Secondary | ICD-10-CM

## 2016-05-18 DIAGNOSIS — R748 Abnormal levels of other serum enzymes: Secondary | ICD-10-CM

## 2016-05-18 DIAGNOSIS — R601 Generalized edema: Secondary | ICD-10-CM

## 2016-05-18 DIAGNOSIS — E162 Hypoglycemia, unspecified: Secondary | ICD-10-CM

## 2016-05-18 DIAGNOSIS — N189 Chronic kidney disease, unspecified: Secondary | ICD-10-CM | POA: Diagnosis not present

## 2016-05-18 DIAGNOSIS — N179 Acute kidney failure, unspecified: Secondary | ICD-10-CM

## 2016-05-18 DIAGNOSIS — I509 Heart failure, unspecified: Secondary | ICD-10-CM

## 2016-05-19 DIAGNOSIS — N189 Chronic kidney disease, unspecified: Secondary | ICD-10-CM | POA: Diagnosis not present

## 2016-05-19 DIAGNOSIS — R748 Abnormal levels of other serum enzymes: Secondary | ICD-10-CM | POA: Diagnosis not present

## 2016-05-19 DIAGNOSIS — N179 Acute kidney failure, unspecified: Secondary | ICD-10-CM | POA: Diagnosis not present

## 2016-05-19 DIAGNOSIS — E162 Hypoglycemia, unspecified: Secondary | ICD-10-CM | POA: Diagnosis not present

## 2017-06-20 ENCOUNTER — Encounter: Payer: Self-pay | Admitting: Cardiology

## 2017-06-20 ENCOUNTER — Ambulatory Visit (INDEPENDENT_AMBULATORY_CARE_PROVIDER_SITE_OTHER): Payer: Medicare Other | Admitting: Cardiology

## 2017-06-20 VITALS — BP 126/60 | HR 97 | Ht 63.0 in | Wt 236.0 lb

## 2017-06-20 DIAGNOSIS — Z8673 Personal history of transient ischemic attack (TIA), and cerebral infarction without residual deficits: Secondary | ICD-10-CM | POA: Diagnosis not present

## 2017-06-20 DIAGNOSIS — E1121 Type 2 diabetes mellitus with diabetic nephropathy: Secondary | ICD-10-CM

## 2017-06-20 DIAGNOSIS — I10 Essential (primary) hypertension: Secondary | ICD-10-CM

## 2017-06-20 DIAGNOSIS — I480 Paroxysmal atrial fibrillation: Secondary | ICD-10-CM

## 2017-06-20 DIAGNOSIS — N289 Disorder of kidney and ureter, unspecified: Secondary | ICD-10-CM | POA: Diagnosis not present

## 2017-06-20 NOTE — Progress Notes (Signed)
Cardiology Office Note:    Date:  06/20/2017   ID:  Cassidy Atkins, DOB [birth year], MRN [id number]  PCP:  Shelbie Ammons, MD  Cardiologist:  Garwin Brothers, MD   Referring MD: Shelbie Ammons, MD    ASSESSMENT:    1. Paroxysmal atrial fibrillation (HCC)   2. Benign essential HTN   3. Type 2 diabetes mellitus with diabetic nephropathy, unspecified whether long term insulin use (HCC)   4. History of CVA (cerebrovascular accident)   5. Morbid obesity (HCC)   6. Renal insufficiency    PLAN:    In order of problems listed above:  1. Primary prevention stressed to the patient. Importance of compliance with diet and medications stressed and the patient vocalized understanding. Patient has history of congestive heart failure. We will obtain echocardiogram report from recent evaluation in the month of April. Congestive heart failure education was given to the patient that she vocalized understanding. 2. Her blood pressure stable at this time 3. Diet was discussed for diabetes mellitus dyslipidemia and obesity and she verbalized understanding. Lipids are followed by her primary care doctor. 4. I discussed with the patient atrial fibrillation, disease process. Management and therapy including rate and rhythm control, anticoagulation benefits and potential risks were discussed extensively with the patient. Patient had multiple questions which were answered to patient's satisfaction. 5. Renal insufficiency issue appears to be stable at this time. This is followed by her primary care physician. Patient will be seen in follow-up appointment in 10-12 months or earlier if she has any concerns.   Medication Adjustments/Labs and Tests Ordered: Current medicines are reviewed at length with the patient today.  Concerns regarding medicines are outlined above.  No orders of the defined types were placed in this encounter.  No orders of the defined types were placed in this encounter.    History of  Present Illness:    Cassidy Atkins is a 71 y.o. female who is being seen today for the evaluation of paroxysmal atrial fibrillation at the request of Shelbie Ammons, MD. Patient is a pleasant 71 year old female. She has past medical history of paroxysmal atrial fibrillation, history of stroke, essential hypertension and diabetes mellitus. She is previously unknown to me. She is brought in by her daughter who is patient. She lives at the facility. Her daughter is very supportive. Patient is alert awake and oriented. She denies any problems at this time. She leads a sedentary lifestyle. She is morbidly obese. At the time of my evaluation she is alert awake oriented and in no distress. She is here to be established.  Past Medical History:  Diagnosis Date  . Anasarca   . CKD (chronic kidney disease), stage III   . DM (diabetes mellitus), type 2 with renal complications (HCC)   . History of CVA (cerebrovascular accident)   . HTN (hypertension)   . Hypercholesterolemia   . Paroxysmal atrial fibrillation [facility])     Past Surgical History:  Procedure Laterality Date  . Right knee surgery      Current Medications: Current Meds  Medication Sig  . acetaminophen (TYLENOL) 325 MG tablet Take 650 mg by mouth every 6 (six) hours as needed for mild pain.  Marland Kitchen ADVAIR DISKUS 250-50 MCG/DOSE AEPB Inhale 1 puff into the lungs daily.   Marland Kitchen aspirin EC 81 MG tablet Take 81 mg by mouth daily.  . ciprofloxacin (CILOXAN) 0.3 % ophthalmic solution Place 2 drops into both eyes every 4 (four) hours while awake.   Marland Kitchen  furosemide (LASIX) 80 MG tablet Take 1 tablet (80 mg total) by mouth 2 (two) times daily.  Marland Kitchen gabapentin (NEURONTIN) 300 MG capsule Take 1 capsule (300 mg total) by mouth 2 (two) times daily.  Marland Kitchen guaiFENesin (MUCINEX) 600 MG 12 hr tablet Take 600 mg by mouth 2 (two) times daily as needed for cough.  . guaifenesin (ROBITUSSIN) 100 MG/5ML syrup Take 200 mg by mouth every 6 (six) hours as needed for cough.  Marland Kitchen  HUMALOG KWIKPEN 100 UNIT/ML KiwkPen Inject 0.08 mLs (8 Units total) into the skin 3 (three) times daily with meals.  . hydrALAZINE (APRESOLINE) 25 MG tablet Take 37.5 mg by mouth 3 (three) times daily.   Marland Kitchen ipratropium-albuterol (DUONEB) 0.5-2.5 (3) MG/3ML SOLN Inhale 3 mLs into the lungs every 2 (two) hours as needed (wheezing).   Marland Kitchen LANTUS SOLOSTAR 100 UNIT/ML Solostar Pen Inject 35 Units into the skin daily at 10 pm.  . loratadine (CLARITIN) 10 MG tablet Take 10 mg by mouth daily.  . magnesium hydroxide (MILK OF MAGNESIA) 400 MG/5ML suspension Take 30 mLs by mouth daily as needed for mild constipation.  . metoprolol (LOPRESSOR) 50 MG tablet Take 50 mg by mouth daily.   . mometasone (NASONEX) 50 MCG/ACT nasal spray Place 2 sprays into the nose daily.   . Multiple Vitamins-Iron (MULTIVITAMINS WITH IRON) TABS tablet Take 1 tablet by mouth daily.  Marland Kitchen oxyCODONE (OXY IR/ROXICODONE) 5 MG immediate release tablet Take 5 mg by mouth every 4 (four) hours as needed for moderate pain.   . promethazine (PHENERGAN) 25 MG tablet Take 25 mg by mouth every 4 (four) hours as needed for nausea.   Marland Kitchen senna (SENOKOT) 8.6 MG TABS tablet Take 2 tablets by mouth at bedtime.  Marland Kitchen SPIRIVA HANDIHALER 18 MCG inhalation capsule Place 18 mcg into inhaler and inhale daily.   Carlena Hurl 15 MG TABS tablet Take 15 mg by mouth daily.     Allergies:   Contrast allergy premed pack  [prednisone & diphenhydramine]   Social History   Social History  . Marital status: Widowed    Spouse name: N/A  . Number of children: N/A  . Years of education: N/A   Social History Main Topics  . Smoking status: Never Smoker  . Smokeless tobacco: Never Used  . Alcohol use No  . Drug use: No  . Sexual activity: Not Asked   Other Topics Concern  . None   Social History Narrative   Lives in nursing home.     Family History: The patient's Family history is unknown by patient.  ROS:   Please see the history of present illness.    All  other systems reviewed and are negative.  EKGs/Labs/Other Studies Reviewed:    The following studies were reviewed today: I reviewed records from the doctor's doctor's office extensively and discussed it with her and her daughter. Questions were answered to their satisfaction. We will try to get report of echocardiogram done in the recent past.   Recent Labs: No results found for requested labs within last 8760 hours.  Recent Lipid Panel    Component Value Date/Time   CHOL 110 02/20/2015 0530   TRIG 104 02/20/2015 0530   HDL 32 (L) 02/20/2015 0530   CHOLHDL 3.4 02/20/2015 0530   VLDL 21 02/20/2015 0530   LDLCALC 57 02/20/2015 0530    Physical Exam:    VS:  BP 126/60   Pulse 97   Ht  (1.6 m)   Wt 236  lb (107 kg)   SpO2 98%   BMI 41.81 kg/m     Wt Readings from Last 3 Encounters:  06/20/17 236 lb (107 kg)  02/21/15 275 lb 2.2 oz (124.8 kg)     GEN: Patient is in no acute distress HEENT: Normal NECK: No JVD; No carotid bruits LYMPHATICS: No lymphadenopathy CARDIAC: S1 S2 regular, 2/6 systolic murmur at the apex. RESPIRATORY:  Clear to auscultation without rales, wheezing or rhonchi  ABDOMEN: Soft, non-tender, non-distended MUSCULOSKELETAL:  No edema; No deformity  SKIN: Warm and dry NEUROLOGIC:  Alert and oriented x 3 PSYCHIATRIC:  Normal affect    Signed, Garwin Brothersajan R Revankar, MD  06/20/2017 2:59 PM    [facility]

## 2017-06-20 NOTE — Patient Instructions (Signed)
Medication Instructions:   Your physician recommends that you continue on your current medications as directed. Please refer to the Current Medication list given to you today.   Labwork:  NONE  Testing/Procedures:  NONE  Follow-Up:  Your physician recommends that you schedule a follow-up appointment in: 10 months with Dr. Tomie Chinaevankar.    Any Other Special Instructions Will Be Listed Below (If Applicable).     If you need a refill on your cardiac medications before your next appointment, please call your pharmacy.

## 2017-07-29 DIAGNOSIS — E785 Hyperlipidemia, unspecified: Secondary | ICD-10-CM | POA: Diagnosis not present

## 2017-07-29 DIAGNOSIS — D62 Acute posthemorrhagic anemia: Secondary | ICD-10-CM

## 2017-07-29 DIAGNOSIS — K922 Gastrointestinal hemorrhage, unspecified: Secondary | ICD-10-CM

## 2017-07-29 DIAGNOSIS — E119 Type 2 diabetes mellitus without complications: Secondary | ICD-10-CM

## 2017-07-29 DIAGNOSIS — N289 Disorder of kidney and ureter, unspecified: Secondary | ICD-10-CM

## 2017-07-30 DIAGNOSIS — E119 Type 2 diabetes mellitus without complications: Secondary | ICD-10-CM | POA: Diagnosis not present

## 2017-07-30 DIAGNOSIS — N289 Disorder of kidney and ureter, unspecified: Secondary | ICD-10-CM | POA: Diagnosis not present

## 2017-07-30 DIAGNOSIS — D62 Acute posthemorrhagic anemia: Secondary | ICD-10-CM | POA: Diagnosis not present

## 2017-07-30 DIAGNOSIS — K922 Gastrointestinal hemorrhage, unspecified: Secondary | ICD-10-CM | POA: Diagnosis not present

## 2017-07-30 DIAGNOSIS — E785 Hyperlipidemia, unspecified: Secondary | ICD-10-CM | POA: Diagnosis not present

## 2017-07-31 DIAGNOSIS — E119 Type 2 diabetes mellitus without complications: Secondary | ICD-10-CM | POA: Diagnosis not present

## 2017-07-31 DIAGNOSIS — K922 Gastrointestinal hemorrhage, unspecified: Secondary | ICD-10-CM | POA: Diagnosis not present

## 2017-07-31 DIAGNOSIS — D62 Acute posthemorrhagic anemia: Secondary | ICD-10-CM | POA: Diagnosis not present

## 2017-07-31 DIAGNOSIS — N289 Disorder of kidney and ureter, unspecified: Secondary | ICD-10-CM | POA: Diagnosis not present

## 2017-08-01 DIAGNOSIS — N289 Disorder of kidney and ureter, unspecified: Secondary | ICD-10-CM | POA: Diagnosis not present

## 2017-08-01 DIAGNOSIS — K922 Gastrointestinal hemorrhage, unspecified: Secondary | ICD-10-CM | POA: Diagnosis not present

## 2017-08-01 DIAGNOSIS — D62 Acute posthemorrhagic anemia: Secondary | ICD-10-CM | POA: Diagnosis not present

## 2017-08-01 DIAGNOSIS — E119 Type 2 diabetes mellitus without complications: Secondary | ICD-10-CM | POA: Diagnosis not present

## 2017-08-02 DIAGNOSIS — E119 Type 2 diabetes mellitus without complications: Secondary | ICD-10-CM | POA: Diagnosis not present

## 2017-08-02 DIAGNOSIS — D62 Acute posthemorrhagic anemia: Secondary | ICD-10-CM | POA: Diagnosis not present

## 2017-08-02 DIAGNOSIS — N289 Disorder of kidney and ureter, unspecified: Secondary | ICD-10-CM | POA: Diagnosis not present

## 2017-08-02 DIAGNOSIS — K922 Gastrointestinal hemorrhage, unspecified: Secondary | ICD-10-CM | POA: Diagnosis not present

## 2017-09-03 ENCOUNTER — Other Ambulatory Visit: Payer: Self-pay

## 2017-09-03 ENCOUNTER — Encounter (HOSPITAL_COMMUNITY): Payer: Self-pay | Admitting: Internal Medicine

## 2017-09-03 ENCOUNTER — Inpatient Hospital Stay (HOSPITAL_COMMUNITY)
Admission: AD | Admit: 2017-09-03 | Discharge: 2017-09-05 | DRG: 682 | Disposition: A | Discharge status: Home / Self Care | Payer: Medicare Other | Source: Other Acute Inpatient Hospital | Attending: Internal Medicine | Admitting: Internal Medicine

## 2017-09-03 ENCOUNTER — Inpatient Hospital Stay (HOSPITAL_COMMUNITY): Payer: Medicare Other

## 2017-09-03 DIAGNOSIS — Z7189 Other specified counseling: Secondary | ICD-10-CM | POA: Diagnosis not present

## 2017-09-03 DIAGNOSIS — J9601 Acute respiratory failure with hypoxia: Secondary | ICD-10-CM | POA: Diagnosis present

## 2017-09-03 DIAGNOSIS — E7800 Pure hypercholesterolemia, unspecified: Secondary | ICD-10-CM | POA: Diagnosis present

## 2017-09-03 DIAGNOSIS — I4891 Unspecified atrial fibrillation: Secondary | ICD-10-CM | POA: Diagnosis present

## 2017-09-03 DIAGNOSIS — I129 Hypertensive chronic kidney disease with stage 1 through stage 4 chronic kidney disease, or unspecified chronic kidney disease: Principal | ICD-10-CM | POA: Diagnosis present

## 2017-09-03 DIAGNOSIS — I480 Paroxysmal atrial fibrillation: Secondary | ICD-10-CM | POA: Diagnosis present

## 2017-09-03 DIAGNOSIS — Z8673 Personal history of transient ischemic attack (TIA), and cerebral infarction without residual deficits: Secondary | ICD-10-CM | POA: Diagnosis not present

## 2017-09-03 DIAGNOSIS — Z7982 Long term (current) use of aspirin: Secondary | ICD-10-CM

## 2017-09-03 DIAGNOSIS — J811 Chronic pulmonary edema: Secondary | ICD-10-CM | POA: Diagnosis present

## 2017-09-03 DIAGNOSIS — Z7901 Long term (current) use of anticoagulants: Secondary | ICD-10-CM | POA: Diagnosis not present

## 2017-09-03 DIAGNOSIS — N185 Chronic kidney disease, stage 5: Secondary | ICD-10-CM

## 2017-09-03 DIAGNOSIS — Z794 Long term (current) use of insulin: Secondary | ICD-10-CM | POA: Diagnosis not present

## 2017-09-03 DIAGNOSIS — J9600 Acute respiratory failure, unspecified whether with hypoxia or hypercapnia: Secondary | ICD-10-CM | POA: Diagnosis present

## 2017-09-03 DIAGNOSIS — J449 Chronic obstructive pulmonary disease, unspecified: Secondary | ICD-10-CM | POA: Diagnosis present

## 2017-09-03 DIAGNOSIS — Z515 Encounter for palliative care: Secondary | ICD-10-CM | POA: Diagnosis present

## 2017-09-03 DIAGNOSIS — N184 Chronic kidney disease, stage 4 (severe): Secondary | ICD-10-CM | POA: Diagnosis present

## 2017-09-03 DIAGNOSIS — N189 Chronic kidney disease, unspecified: Secondary | ICD-10-CM

## 2017-09-03 DIAGNOSIS — Z885 Allergy status to narcotic agent status: Secondary | ICD-10-CM

## 2017-09-03 DIAGNOSIS — Z833 Family history of diabetes mellitus: Secondary | ICD-10-CM | POA: Diagnosis not present

## 2017-09-03 DIAGNOSIS — Z66 Do not resuscitate: Secondary | ICD-10-CM | POA: Diagnosis present

## 2017-09-03 DIAGNOSIS — N170 Acute kidney failure with tubular necrosis: Secondary | ICD-10-CM

## 2017-09-03 DIAGNOSIS — R0602 Shortness of breath: Secondary | ICD-10-CM

## 2017-09-03 DIAGNOSIS — Z22322 Carrier or suspected carrier of Methicillin resistant Staphylococcus aureus: Secondary | ICD-10-CM

## 2017-09-03 DIAGNOSIS — N179 Acute kidney failure, unspecified: Secondary | ICD-10-CM | POA: Diagnosis present

## 2017-09-03 DIAGNOSIS — I509 Heart failure, unspecified: Secondary | ICD-10-CM | POA: Diagnosis present

## 2017-09-03 DIAGNOSIS — J810 Acute pulmonary edema: Secondary | ICD-10-CM | POA: Diagnosis present

## 2017-09-03 DIAGNOSIS — Z6841 Body Mass Index (BMI) 40.0 and over, adult: Secondary | ICD-10-CM | POA: Diagnosis not present

## 2017-09-03 DIAGNOSIS — Z91041 Radiographic dye allergy status: Secondary | ICD-10-CM | POA: Diagnosis not present

## 2017-09-03 DIAGNOSIS — E1122 Type 2 diabetes mellitus with diabetic chronic kidney disease: Secondary | ICD-10-CM | POA: Diagnosis present

## 2017-09-03 DIAGNOSIS — E875 Hyperkalemia: Secondary | ICD-10-CM | POA: Diagnosis not present

## 2017-09-03 DIAGNOSIS — D631 Anemia in chronic kidney disease: Secondary | ICD-10-CM | POA: Diagnosis not present

## 2017-09-03 DIAGNOSIS — Z79899 Other long term (current) drug therapy: Secondary | ICD-10-CM | POA: Diagnosis not present

## 2017-09-03 DIAGNOSIS — E1129 Type 2 diabetes mellitus with other diabetic kidney complication: Secondary | ICD-10-CM | POA: Diagnosis present

## 2017-09-03 DIAGNOSIS — Z888 Allergy status to other drugs, medicaments and biological substances status: Secondary | ICD-10-CM

## 2017-09-03 DIAGNOSIS — I10 Essential (primary) hypertension: Secondary | ICD-10-CM | POA: Diagnosis present

## 2017-09-03 LAB — CBC WITH DIFFERENTIAL/PLATELET
Basophils Absolute: 0 10*3/uL (ref 0.0–0.1)
Basophils Relative: 0 %
EOS ABS: 0.1 10*3/uL (ref 0.0–0.7)
EOS PCT: 1 %
HCT: 27.1 % — ABNORMAL LOW (ref 36.0–46.0)
Hemoglobin: 7.9 g/dL — ABNORMAL LOW (ref 12.0–15.0)
LYMPHS ABS: 1.5 10*3/uL (ref 0.7–4.0)
Lymphocytes Relative: 18 %
MCH: 24.8 pg — AB (ref 26.0–34.0)
MCHC: 29.2 g/dL — ABNORMAL LOW (ref 30.0–36.0)
MCV: 85 fL (ref 78.0–100.0)
MONO ABS: 0.8 10*3/uL (ref 0.1–1.0)
Monocytes Relative: 9 %
NEUTROS PCT: 72 %
Neutro Abs: 6.2 10*3/uL (ref 1.7–7.7)
PLATELETS: 254 10*3/uL (ref 150–400)
RBC: 3.19 MIL/uL — AB (ref 3.87–5.11)
RDW: 17.8 % — AB (ref 11.5–15.5)
WBC: 8.6 10*3/uL (ref 4.0–10.5)

## 2017-09-03 LAB — MRSA PCR SCREENING: MRSA BY PCR: POSITIVE — AB

## 2017-09-03 LAB — URINALYSIS, ROUTINE W REFLEX MICROSCOPIC
BILIRUBIN URINE: NEGATIVE
GLUCOSE, UA: NEGATIVE mg/dL
KETONES UR: NEGATIVE mg/dL
NITRITE: NEGATIVE
PH: 7 (ref 5.0–8.0)
Protein, ur: NEGATIVE mg/dL
Specific Gravity, Urine: 1.009 (ref 1.005–1.030)

## 2017-09-03 LAB — GLUCOSE, CAPILLARY
GLUCOSE-CAPILLARY: 131 mg/dL — AB (ref 65–99)
GLUCOSE-CAPILLARY: 110 mg/dL — AB (ref 65–99)
Glucose-Capillary: 87 mg/dL (ref 65–99)
Glucose-Capillary: 100 mg/dL — ABNORMAL HIGH (ref 65–99)
Glucose-Capillary: 116 mg/dL — ABNORMAL HIGH (ref 65–99)

## 2017-09-03 LAB — BLOOD GAS, ARTERIAL
ACID-BASE EXCESS: 7.6 mmol/L — AB (ref 0.0–2.0)
BICARBONATE: 32.6 mmol/L — AB (ref 20.0–28.0)
Delivery systems: POSITIVE
Drawn by: 511551
Expiratory PAP: 6
FIO2: 30
Inspiratory PAP: 14
O2 SAT: 96.2 %
PATIENT TEMPERATURE: 98.6
PH ART: 7.389 (ref 7.350–7.450)
PO2 ART: 86.4 mmHg (ref 83.0–108.0)
pCO2 arterial: 55.1 mmHg — ABNORMAL HIGH (ref 32.0–48.0)

## 2017-09-03 LAB — BASIC METABOLIC PANEL
Anion gap: 13 (ref 5–15)
BUN: 118 mg/dL — AB (ref 6–20)
CALCIUM: 8.8 mg/dL — AB (ref 8.9–10.3)
CHLORIDE: 98 mmol/L — AB (ref 101–111)
CO2: 30 mmol/L (ref 22–32)
CREATININE: 3.79 mg/dL — AB (ref 0.44–1.00)
GFR calc non Af Amer: 11 mL/min — ABNORMAL LOW (ref >60)
GFR, EST AFRICAN AMERICAN: 13 mL/min — AB (ref >60)
Glucose, Bld: 87 mg/dL (ref 65–99)
Potassium: 5.3 mmol/L — ABNORMAL HIGH (ref 3.5–5.1)
SODIUM: 141 mmol/L (ref 135–145)

## 2017-09-03 LAB — HEPATIC FUNCTION PANEL
ALT: 19 U/L (ref 14–54)
AST: 29 U/L (ref 15–41)
Albumin: 3.1 g/dL — ABNORMAL LOW (ref 3.5–5.0)
Alkaline Phosphatase: 72 U/L (ref 38–126)
BILIRUBIN DIRECT: 0.3 mg/dL (ref 0.1–0.5)
BILIRUBIN TOTAL: 1.3 mg/dL — AB (ref 0.3–1.2)
Indirect Bilirubin: 1 mg/dL — ABNORMAL HIGH (ref 0.3–0.9)
Total Protein: 7.6 g/dL (ref 6.5–8.1)

## 2017-09-03 LAB — PROTIME-INR
INR: 1.24
PROTHROMBIN TIME: 15.5 s — AB (ref 11.4–15.2)

## 2017-09-03 LAB — TYPE AND SCREEN
ABO/RH(D): B POS
Antibody Screen: NEGATIVE

## 2017-09-03 LAB — ABO/RH: ABO/RH(D): B POS

## 2017-09-03 LAB — PHOSPHORUS: PHOSPHORUS: 7.9 mg/dL — AB (ref 2.5–4.6)

## 2017-09-03 LAB — APTT: aPTT: 29 seconds (ref 24–36)

## 2017-09-03 LAB — HEPARIN LEVEL (UNFRACTIONATED): Heparin Unfractionated: 0.41 IU/mL (ref 0.30–0.70)

## 2017-09-03 MED ORDER — HYDRALAZINE HCL 25 MG PO TABS
37.5000 mg | ORAL_TABLET | Freq: Three times a day (TID) | ORAL | Status: DC
  Filled 2017-09-03: qty 2

## 2017-09-03 MED ORDER — ORAL CARE MOUTH RINSE
15.0000 mL | Freq: Two times a day (BID) | OROMUCOSAL | Status: DC
  Administered 2017-09-04 (×2): 15 mL via OROMUCOSAL

## 2017-09-03 MED ORDER — DILTIAZEM HCL 100 MG IV SOLR
5.0000 mg/h | INTRAVENOUS | Status: DC
  Administered 2017-09-03 – 2017-09-04 (×4): 15 mg/h via INTRAVENOUS
  Administered 2017-09-04: 5 mg/h via INTRAVENOUS
  Filled 2017-09-03 (×6): qty 100

## 2017-09-03 MED ORDER — ONDANSETRON HCL 4 MG/2ML IJ SOLN
4.0000 mg | Freq: Four times a day (QID) | INTRAMUSCULAR | Status: DC | PRN
  Administered 2017-09-03: 4 mg via INTRAVENOUS
  Filled 2017-09-03: qty 2

## 2017-09-03 MED ORDER — ACETAMINOPHEN 325 MG PO TABS: 650.0000 mg | ORAL_TABLET | Freq: Four times a day (QID) | ORAL | Status: DC | PRN

## 2017-09-03 MED ORDER — TAB-A-VITE/IRON PO TABS
1.0000 | ORAL_TABLET | Freq: Every day | ORAL | Status: DC
  Filled 2017-09-03 (×3): qty 1

## 2017-09-03 MED ORDER — HEPARIN (PORCINE) IN NACL 100-0.45 UNIT/ML-% IJ SOLN
1200.0000 [IU]/h | INTRAMUSCULAR | Status: DC
  Administered 2017-09-03 – 2017-09-04 (×3): 1200 [IU]/h via INTRAVENOUS
  Filled 2017-09-03 (×3): qty 250

## 2017-09-03 MED ORDER — METOPROLOL TARTRATE 50 MG PO TABS
50.0000 mg | ORAL_TABLET | Freq: Every day | ORAL | Status: DC
  Filled 2017-09-03: qty 1

## 2017-09-03 MED ORDER — INSULIN ASPART 100 UNIT/ML ~~LOC~~ SOLN: 0.0000 [IU] | Freq: Three times a day (TID) | SUBCUTANEOUS | Status: DC

## 2017-09-03 MED ORDER — IPRATROPIUM-ALBUTEROL 0.5-2.5 (3) MG/3ML IN SOLN: 3.0000 mL | RESPIRATORY_TRACT | Status: DC | PRN

## 2017-09-03 MED ORDER — ONDANSETRON HCL 4 MG PO TABS: 4.0000 mg | ORAL_TABLET | Freq: Four times a day (QID) | ORAL | Status: DC | PRN

## 2017-09-03 MED ORDER — MOMETASONE FURO-FORMOTEROL FUM 200-5 MCG/ACT IN AERO
2.0000 | INHALATION_SPRAY | Freq: Two times a day (BID) | RESPIRATORY_TRACT | Status: DC
  Administered 2017-09-04: 2 via RESPIRATORY_TRACT
  Filled 2017-09-03: qty 8.8

## 2017-09-03 MED ORDER — DARBEPOETIN ALFA 150 MCG/0.3ML IJ SOSY
150.0000 ug | PREFILLED_SYRINGE | INTRAMUSCULAR | Status: DC
  Administered 2017-09-03: 150 ug via SUBCUTANEOUS
  Filled 2017-09-03: qty 0.3

## 2017-09-03 MED ORDER — FUROSEMIDE 10 MG/ML IJ SOLN
120.0000 mg | Freq: Two times a day (BID) | INTRAVENOUS | Status: DC
  Filled 2017-09-03 (×2): qty 12

## 2017-09-03 MED ORDER — INSULIN ASPART 100 UNIT/ML ~~LOC~~ SOLN
0.0000 [IU] | SUBCUTANEOUS | Status: DC
  Administered 2017-09-04 (×3): 3 [IU] via SUBCUTANEOUS
  Administered 2017-09-04 (×3): 2 [IU] via SUBCUTANEOUS

## 2017-09-03 MED ORDER — ACETAMINOPHEN 650 MG RE SUPP: 650.0000 mg | Freq: Four times a day (QID) | RECTAL | Status: DC | PRN

## 2017-09-03 MED ORDER — FUROSEMIDE 10 MG/ML IJ SOLN
120.0000 mg | Freq: Three times a day (TID) | INTRAVENOUS | Status: DC
  Administered 2017-09-03 – 2017-09-04 (×3): 120 mg via INTRAVENOUS
  Filled 2017-09-03: qty 12
  Filled 2017-09-03: qty 10
  Filled 2017-09-03 (×2): qty 12

## 2017-09-03 MED ORDER — HEPARIN BOLUS VIA INFUSION
3000.0000 [IU] | Freq: Once | INTRAVENOUS | Status: AC
  Administered 2017-09-03: 3000 [IU] via INTRAVENOUS
  Filled 2017-09-03: qty 3000

## 2017-09-03 MED ORDER — ATORVASTATIN CALCIUM 10 MG PO TABS: 10.0000 mg | ORAL_TABLET | Freq: Every day | ORAL | Status: DC

## 2017-09-03 MED ORDER — CHLORHEXIDINE GLUCONATE 0.12 % MT SOLN
15.0000 mL | Freq: Two times a day (BID) | OROMUCOSAL | Status: DC
  Administered 2017-09-03 – 2017-09-04 (×3): 15 mL via OROMUCOSAL
  Filled 2017-09-03 (×2): qty 15

## 2017-09-03 NOTE — Progress Notes (Addendum)
ANTICOAGULATION CONSULT NOTE - Initial Consult  Pharmacy Consult for heparin Indication: atrial fibrillation  Allergies  Allergen Reactions  . Contrast Allergy Premed Pack  [Prednisone & Diphenhydramine] Itching    Hives    Patient Measurements: Height: 5\' 3"  (160 cm) Weight: 244 lb 0.8 oz (110.7 kg) IBW/kg (Calculated) : 52.4 Heparin Dosing Weight: 80kg  Vital Signs: Temp: 97.3 F (36.3 C) (11/25 0531) Temp Source: Axillary (11/25 0531) Pulse Rate: 106 (11/25 0539)   Medical History: Past Medical History:  Diagnosis Date  . Anasarca   . CKD (chronic kidney disease), stage III (HCC)   . DM (diabetes mellitus), type 2 with renal complications (HCC)   . History of CVA (cerebrovascular accident)   . HTN (hypertension)   . Hypercholesterolemia   . Paroxysmal atrial fibrillation [facility](HCC)     Assessment: 71yo female tx'd from OSH, to begin heparin for Afib.  SCr ~499mo ago was 2.24.  Of note, outpt notes mention pt takes Xarelto 15mg  daily at home; pt in on bipap and can't talk currently but family believes she was told by MD to stop taking Xarelto; unclear if pt took Xarelto in last 24 hours.  Goal of Therapy:  Heparin level 0.3-0.7 units/ml aPTT 66-102 seconds Monitor platelets by anticoagulation protocol: Yes   Plan:  Will begin heparin gtt at 1200 units/hr and monitor heparin levels, PTTs (if Xarelto is affecting anti-Xa), and CBC.  Vernard GamblesVeronda Bryk, PharmD, BCPS  09/03/2017,6:41 AM    10:30 AM: Baseline heparin level < 0.1, likely not taking Xarelto prior to admission, will give 3000 unit bolus of heparin now and dc daily PTTS  Thank you Okey RegalLisa Rudean Icenhour, PharmD [phone]

## 2017-09-03 NOTE — Consult Note (Signed)
[facility] Consultation Note Requesting Physician:  [facility] Primary Nephrologist: ? 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 year-old PMH DM, HTN, CKD4, AFib, anemia. Presented to [facility] with SOB, found to be hypoxic, required BIPAP. Also AF with RVR started on dilt drip.Because of elevated creatinine, hyperkalemia, and concern about possible need for dialysis was transferred to [facility]. She has had prior episodes of AKI (2016 here) and at that time was bedbound and not felt to be a candidate for long term HD if it ever came to that.  She is followed by a nephrologist in [facility]. Family says dialysis was recommended to her in past and she declined. Discussed that with her and family and despite the BIPAP interference with the conversation was clear that she would not want it. (She is still a long term SNF resident, requires Michiel Sites to get into wheelchair, is not ambulatory)  Creatinine on arrival here 3.79. Making some urine. Lasix 120 ordered for BID but not yet given.   Creatinine, Ser  Date/Time Value Ref Range Status  09/03/2017 08:28 AM 3.79 (H) 0.44 - 1.00 mg/dL Final  40/98/1191 47:82 AM 1.76 (H) 0.44 - 1.00 mg/dL Final  95/62/1308 65:78 AM 1.99 (H) 0.44 - 1.00 mg/dL Final  46/96/2952 84:13 AM 2.65 (H) 0.44 - 1.00 mg/dL Final  24/40/1027 25:36 AM 3.69 (H) 0.44 - 1.00 mg/dL Final    Past Medical History:  Diagnosis Date  . Anasarca   . CKD (chronic kidney disease), stage III (HCC)   . DM (diabetes mellitus), type 2 with renal complications (HCC)   . History of CVA (cerebrovascular accident)   . HTN (hypertension)   . Hypercholesterolemia   . Paroxysmal atrial fibrillation [facility])      Past Surgical History:  Procedure Laterality Date  . Right knee surgery      Family History:  Family History  Problem Relation Age of Onset  . Diabetes Mellitus II Mother   . Diabetes Mellitus II Father    Social History:  reports that  has  never smoked. she has never used smokeless tobacco. She reports that she does not drink alcohol or use drugs.   Allergies  Allergen Reactions  . Codeine Other (See Comments)  . Contrast Allergy Premed Pack  [Prednisone & Diphenhydramine] Itching    Hives  . Lipitor [Atorvastatin] Other (See Comments)    Home medications: Prior to Admission medications   Medication Sig Start Date End Date Taking? Authorizing Provider  acetaminophen (TYLENOL) 325 MG tablet Take 650 mg by mouth every 6 (six) hours as needed for mild pain.   Yes [provider]  ADVAIR DISKUS 250-50 MCG/DOSE AEPB Inhale 1 puff into the lungs 2 (two) times daily.  02/16/15  Yes [provider]  aspirin EC 81 MG tablet Take 81 mg by mouth daily.   Yes [provider]  empagliflozin (JARDIANCE) 10 MG TABS tablet Take 10 mg by mouth daily.   Yes [provider]  Eyelid Cleansers (OCUSOFT EYELID CLEANSING) PADS Apply 1 application topically 2 (two) times daily. Apply to eye lids   Yes [provider]  fluticasone (FLONASE) 50 MCG/ACT nasal spray Place 2 sprays into both nostrils daily.   Yes [provider]  gabapentin (NEURONTIN) 300 MG capsule Take 1 capsule (300 mg total) by mouth 2 (two) times daily. 02/23/15  Yes Clydia Llano, MD  glucagon (GLUCAGEN) 1 MG SOLR injection Inject 1 mg into  the vein once as needed for low blood sugar.   Yes [provider]  guaifenesin (ROBITUSSIN) 100 MG/5ML syrup Take 200 mg by mouth every 6 (six) hours as needed for cough.   Yes [provider]  HUMALOG KWIKPEN 100 UNIT/ML KiwkPen Inject 0.08 mLs (8 Units total) into the skin 3 (three) times daily with meals. Patient taking differently: Inject 0-10 Units into the skin See admin instructions. Sliding scale: 0-150=0u, 151-200=2u, 201-250=4u, 251-300=6u, 301-350=8u, 351-400=10u, greater than 400 call Janalee Daneeresa [employer]. Check before meals, and at bedtime 02/23/15  Yes Clydia LlanoElmahi, Mutaz, MD   hydrALAZINE (APRESOLINE) 25 MG tablet Take 37.5 mg by mouth 3 (three) times daily.  02/14/15  Yes [provider]  Insulin Glargine (BASAGLAR KWIKPEN) 100 UNIT/ML SOPN Inject 79 Units into the skin 2 (two) times daily.   Yes [provider]  insulin lispro (HUMALOG) 100 UNIT/ML injection Inject 25 Units into the skin 3 (three) times daily with meals.   Yes [provider]  ipratropium-albuterol (DUONEB) 0.5-2.5 (3) MG/3ML SOLN Inhale 3 mLs into the lungs every 2 (two) hours as needed (wheezing).  12/16/14  Yes [provider]  linagliptin (TRADJENTA) 5 MG TABS tablet Take 5 mg by mouth daily.   Yes [provider]  loratadine (CLARITIN) 10 MG tablet Take 10 mg by mouth daily.   Yes [provider]  metolazone (ZAROXOLYN) 5 MG tablet Take 5 mg by mouth daily.   Yes [provider]  metoprolol tartrate (LOPRESSOR) 25 MG tablet Take 25 mg by mouth daily.  02/09/15  Yes [provider]  Multiple Vitamin (MULTIVITAMIN WITH MINERALS) TABS tablet Take 1 tablet by mouth daily.   Yes [provider]  omeprazole (PRILOSEC) 40 MG capsule Take 40 mg by mouth daily.   Yes [provider]  ondansetron (ZOFRAN-ODT) 4 MG disintegrating tablet Take 4 mg by mouth every 8 (eight) hours as needed for nausea or vomiting.   Yes [provider]  Polyethyl Glycol-Propyl Glycol (SYSTANE) 0.4-0.3 % SOLN Place 1 drop into both eyes 3 (three) times daily.   Yes [provider]  polyethylene glycol (MIRALAX / GLYCOLAX) packet Take 17 g by mouth daily.   Yes [provider]  potassium chloride (K-DUR,KLOR-CON) 10 MEQ tablet Take 10 mEq by mouth 3 (three) times daily.   Yes [provider]  senna (SENOKOT) 8.6 MG TABS tablet Take 17.2 tablets by mouth 2 (two) times daily.    Yes [provider]  SPIRIVA HANDIHALER 18 MCG inhalation capsule Place 18 mcg into inhaler and inhale daily.  02/16/15  Yes  [provider]  torsemide (DEMADEX) 20 MG tablet Take 40 mg by mouth 2 (two) times daily.   Yes [provider]  Vitamin D, Ergocalciferol, (DRISDOL) 50000 units CAPS capsule Take 50,000 Units by mouth every 30 (thirty) days.   Yes [provider]  furosemide (LASIX) 80 MG tablet Take 1 tablet (80 mg total) by mouth 2 (two) times daily. Patient not taking: Reported on 09/03/2017 02/23/15   Clydia LlanoElmahi, Mutaz, MD    Inpatient medications: . chlorhexidine  15 mL Mouth Rinse BID  . hydrALAZINE  37.5 mg Oral TID  . insulin aspart  0-15 Units Subcutaneous Q4H  . mouth rinse  15 mL Mouth Rinse q12n4p  . metoprolol tartrate  50 mg Oral Daily  . mometasone-formoterol  2 puff Inhalation BID  . multivitamins with iron  1 tablet Oral Daily   . diltiazem (CARDIZEM) infusion 15 mg/hr (  09/03/17 1012)  . furosemide    . heparin 1,200 Units/hr (09/03/17 0729)    Review of Systems Unable due to BIPAP   Physical Exam:  Blood pressure 101/69, pulse (!) 114, temperature (!) 97 F (36.1 C), temperature source Axillary, resp. rate 16, height 5\' 3"  (1.6 m), weight 110.7 kg (244 lb 0.8 oz), SpO2 100 %.  Gen: Morbidly obese AAF, on BIPAP looks comfortable,and says "getting enough air but I have a booger up my nose" Son and daughter in with her Lines/tubes: PIV, foley Cannot see neck veins due to body habitus Lungs anteriorly are clear Irreg irreg AF HR 110-110 S1S2 No audible SD3 or murmur but BIPAP obscures heart sounds  Skin: no rash, cyanosis Obese abdomen, very large pannus, no focal tenderness 3+ pitting edema both LE's, some woody nature to it with chronic skin changes Foley dark urine   Recent Labs  Lab 09/03/17 0828  NA 141  K 5.3*  CL 98*  CO2 30  GLUCOSE 87  BUN 118*  CREATININE 3.79*  CALCIUM 8.8*  PHOS 7.9*    Recent Labs  Lab 09/03/17 0828  AST 29  ALT 19  ALKPHOS 72  BILITOT 1.3*  PROT 7.6  ALBUMIN 3.1*    Recent Labs  Lab 09/03/17 0828   WBC 8.6  NEUTROABS 6.2  HGB 7.9*  HCT 27.1*  MCV 85.0  PLT 254   CBG: Recent Labs  Lab 09/03/17 0558 09/03/17 0729 09/03/17 1203  GLUCAP 131* 110* 87    Xrays/Other Studies: Dg Chest Port 1 View  Result Date: 09/03/2017 CLINICAL DATA:  Shortness of breath. History of diabetes, hypertension, stroke, and atrial fibrillation. EXAM: PORTABLE CHEST 1 VIEW COMPARISON:  09/02/2017 FINDINGS: Shallow inspiration. Cardiac enlargement without vascular congestion. Increasing density in the right lung base with small right pleural effusion. This suggest developing pneumonia. No focal consolidation in the left lung. Calcification of the aorta. IMPRESSION: Cardiac enlargement. Small right pleural effusion with increasing opacity in the right lung base suggesting developing pneumonia. Electronically Signed   By: Burman NievesWilliam  Stevens M.D.   On: 09/03/2017 06:25    Background: 71 y.o. year-old PMH DM, HTN, CKD4, AFib, anemia. [facility], found to be hypoxic, required BIPAP. Also AF with RVR started on dilt drip.Because of elevated creatinine, hyperkalemia (treated there), and concern about possible need for dialysis [facility]. Poor candidate for long term HD (not there yet) and does not want dialysis regardless.  Assessment/Recommendations  1. AKI (?) on CKD4 - not sure what most recent creatinine has been so ? AKI vs progression of underlying ds. Clearly higher than when last here 2016. Had prior episodes of AKI (2016 here) and at that time due to bedbound status not felt to be a candidate for long term HD if it ever came to that.(still in facility, non-ambulatory, [place] to chair) She has also said to family in the past no dialysis and when asked today in presence of son and daughter indicates wishes not to have HD. 2. Acute resp failure - volume overload/AF.BIPAP. Diuretics. No HD (pt wishes and would be poor candidate)  3. Volume overload/CHF - AM dose lasix not given. 120  BID IV ordered. Will give now, increase to Q8 hours, assess UOP response 4. AF with RVR - HR slower on dilt drip/metoprolol. IV heparin 5. Anemia - check Fe studies. IV fe if needed, dose of Aranesp 200 today.   Camille Balynthia Dung Salinger,  MD [facility] [phone]  pager 09/03/2017, 12:25 PM

## 2017-09-03 NOTE — Progress Notes (Signed)
ANTICOAGULATION CONSULT NOTE   Pharmacy Consult for heparin Indication: atrial fibrillation  Allergies  Allergen Reactions  . Codeine Other (See Comments)  . Contrast Allergy Premed Pack  [Prednisone & Diphenhydramine] Itching    Hives  . Lipitor [Atorvastatin] Other (See Comments)    Patient Measurements: Height: 5\' 3"  (160 cm) Weight: 244 lb 0.8 oz (110.7 kg) IBW/kg (Calculated) : 52.4 Heparin Dosing Weight: 80kg  Vital Signs: Temp: 97.5 F (36.4 C) (11/25 1358) Temp Source: Axillary (11/25 1358) BP: 104/78 (11/25 1500) Pulse Rate: 111 (11/25 1500)   Medical History: Past Medical History:  Diagnosis Date  . Anasarca   . CKD (chronic kidney disease), stage III (HCC)   . DM (diabetes mellitus), type 2 with renal complications (HCC)   . History of CVA (cerebrovascular accident)   . HTN (hypertension)   . Hypercholesterolemia   . Paroxysmal atrial fibrillation [facility](HCC)     Assessment: 71yo female tx'd from OSH, to begin heparin for Afib.  SCr ~4279mo ago was 2.24. Heparin level therapeutic.  Goal of Therapy:  Heparin level = 0.3 to 0.7 Monitor platelets by anticoagulation protocol: Yes   Plan:  Heparin at 1200 units / hr Follow up AM labs  Thank you Okey RegalLisa Rafan Sanders, PharmD [phone] 09/03/2017,4:17 PM

## 2017-09-03 NOTE — H&P (Signed)
History and Physical    Cassidy AntonMary F Lenz ZOX:[id number] DOB: 12/15/1945 DOA: 09/03/2017  PCP: Shelbie AmmonsHaque, Imran P, MD  Patient coming from: Patient was transferred from [facility].  Chief Complaint: Shortness of breath.  HPI: Cassidy Atkins is a 71 y.o. 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  Patient states he has been compliant with her medications.  Denies any chest pain fever or chills.  ED Course: The patient was found to be hypoxic and had to be placed on BiPAP.  Patient's creatinine has doubled to 4 and baseline as per the ER report was around 2.  Patient's potassium was also 5.9.  The EKG was showing A. fib with RVR for which patient was started on Cardizem infusion.  Further potassium patient was given D50 insulin and calcium.  Patient was given Lasix 80 mg IV and transferred to [facility] since patient may need dialysis.  On my exam patient appears mildly lethargic but denies any chest pain.  Patient is on BiPAP and appears mildly short of breath.  Has bilateral lower extremity edema.  I have reviewed patient's labs obtained from [facility].    Review of Systems: As per HPI, rest all negative.   Past Medical History:  Diagnosis Date  . Anasarca   . CKD (chronic kidney disease), stage III (HCC)   . DM (diabetes mellitus), type 2 with renal complications (HCC)   . History of CVA (cerebrovascular accident)   . HTN (hypertension)   . Hypercholesterolemia   . Paroxysmal atrial fibrillation [facility](HCC)     Past Surgical History:  Procedure Laterality Date  . Right knee surgery       reports that  has never smoked. she has never used smokeless tobacco. She reports that she does not drink alcohol or use  drugs.  Allergies  Allergen Reactions  . Codeine   . Contrast Allergy Premed Pack  [Prednisone & Diphenhydramine] Itching    Hives  . Lipitor [Atorvastatin]     Family History  Problem Relation Age of Onset  . Diabetes Mellitus II Mother   . Diabetes Mellitus II Father     Prior to Admission medications   Medication Sig Start Date End Date Taking? Authorizing Provider  acetaminophen (TYLENOL) 325 MG tablet Take 650 mg by mouth every 6 (six) hours as needed for mild pain.    [provider]  ADVAIR DISKUS 250-50 MCG/DOSE AEPB Inhale 1 puff into the lungs daily.  02/16/15   [provider]  aspirin EC 81 MG tablet Take 81 mg by mouth daily.    [provider]  atorvastatin (LIPITOR) 10 MG tablet Take 10 mg by mouth daily. 01/25/15   [provider]  ciprofloxacin (CILOXAN) 0.3 % ophthalmic solution Place 2 drops into both eyes every 4 (four) hours while awake.  01/30/15   [provider]  ciprofloxacin (CIPRO) 500 MG tablet Take 1 tablet (500 mg total) by mouth 2 (two) times daily. Patient not taking: Reported on 06/20/2017 02/23/15   Clydia LlanoElmahi, Mutaz, MD  furosemide (LASIX) 80 MG tablet Take 1 tablet (80 mg total) by mouth 2 (two) times daily. 02/23/15   Clydia LlanoElmahi, Mutaz, MD  gabapentin (NEURONTIN) 300 MG capsule Take 1 capsule (300 mg total) by mouth 2 (two)  times daily. 02/23/15   Clydia LlanoElmahi, Mutaz, MD  guaiFENesin (MUCINEX) 600 MG 12 hr tablet Take 600 mg by mouth 2 (two) times daily as needed for cough.    [provider]  guaifenesin (ROBITUSSIN) 100 MG/5ML syrup Take 200 mg by mouth every 6 (six) hours as needed for cough.    [provider]  HUMALOG KWIKPEN 100 UNIT/ML KiwkPen Inject 0.08 mLs (8 Units total) into the skin 3 (three) times daily with meals. 02/23/15   Clydia LlanoElmahi, Mutaz, MD  hydrALAZINE (APRESOLINE) 25 MG tablet Take 37.5 mg by mouth 3 (three) times daily.  02/14/15   [provider]  ipratropium-albuterol (DUONEB)  0.5-2.5 (3) MG/3ML SOLN Inhale 3 mLs into the lungs every 2 (two) hours as needed (wheezing).  12/16/14   [provider]  LANTUS SOLOSTAR 100 UNIT/ML Solostar Pen Inject 35 Units into the skin daily at 10 pm. 02/23/15   Clydia LlanoElmahi, Mutaz, MD  loratadine (CLARITIN) 10 MG tablet Take 10 mg by mouth daily.    [provider]  magnesium hydroxide (MILK OF MAGNESIA) 400 MG/5ML suspension Take 30 mLs by mouth daily as needed for mild constipation.    [provider]  metoprolol (LOPRESSOR) 50 MG tablet Take 50 mg by mouth daily.  02/09/15   [provider]  mometasone (NASONEX) 50 MCG/ACT nasal spray Place 2 sprays into the nose daily.  01/28/15   [provider]  Multiple Vitamins-Iron (MULTIVITAMINS WITH IRON) TABS tablet Take 1 tablet by mouth daily.    [provider]  oxyCODONE (OXY IR/ROXICODONE) 5 MG immediate release tablet Take 5 mg by mouth every 4 (four) hours as needed for moderate pain.  02/05/15   [provider]  promethazine (PHENERGAN) 25 MG tablet Take 25 mg by mouth every 4 (four) hours as needed for nausea.  01/21/15   [provider]  senna (SENOKOT) 8.6 MG TABS tablet Take 2 tablets by mouth at bedtime.    [provider]  SPIRIVA HANDIHALER 18 MCG inhalation capsule Place 18 mcg into inhaler and inhale daily.  02/16/15   [provider]  XARELTO 15 MG TABS tablet Take 15 mg by mouth daily. 02/12/15   [provider]    Physical Exam: Vitals:   09/03/17 0600 09/03/17 0700 09/03/17 0706 09/03/17 0733  BP:  (!) 100/57  101/69  Pulse: (!) 104 (!) 59  (!) 105  Resp: 15 15  13   Temp:   (!) 97 F (36.1 C)   TempSrc:   Axillary   SpO2: 100% 100%  100%  Weight: 110.7 kg (244 lb 0.8 oz)     Height: 5\' 3"  (1.6 m)         Constitutional: Moderately built and nourished. Vitals:   09/03/17 0600 09/03/17 0700 09/03/17 0706 09/03/17 0733  BP:  (!) 100/57  101/69  Pulse: (!) 104 (!) 59  (!) 105   Resp: 15 15  13   Temp:   (!) 97 F (36.1 C)   TempSrc:   Axillary   SpO2: 100% 100%  100%  Weight: 110.7 kg (244 lb 0.8 oz)     Height: 5\' 3"  (1.6 m)      Eyes: Anicteric no pallor. ENMT: No discharge from the ears eyes nose or mouth. Neck: No JVD appreciated.  No mass felt. Respiratory: Bilateral coarse crepitations. Cardiovascular: S1-S2 heard no murmurs appreciated. Abdomen: Soft nontender bowel sounds present. Musculoskeletal: Bilateral lower estimate edema. Skin: No rash.  Skin appears warm. Neurologic: Appears  mildly lethargic but answers questions and moves all extremities. Psychiatric: Mildly lethargic.   Labs on Admission: I have personally reviewed following labs and imaging studies  CBC: No results for input(s): WBC, NEUTROABS, HGB, HCT, MCV, PLT in the last 168 hours. Basic Metabolic Panel: No results for input(s): NA, K, CL, CO2, GLUCOSE, BUN, CREATININE, CALCIUM, MG, PHOS in the last 168 hours. GFR: CrCl cannot be calculated (Patient's most recent lab result is older than the maximum 21 days allowed.). Liver Function Tests: No results for input(s): AST, ALT, ALKPHOS, BILITOT, PROT, ALBUMIN in the last 168 hours. No results for input(s): LIPASE, AMYLASE in the last 168 hours. No results for input(s): AMMONIA in the last 168 hours. Coagulation Profile: No results for input(s): INR, PROTIME in the last 168 hours. Cardiac Enzymes: No results for input(s): CKTOTAL, CKMB, CKMBINDEX, TROPONINI in the last 168 hours. BNP (last 3 results) No results for input(s): PROBNP in the last 8760 hours. HbA1C: No results for input(s): HGBA1C in the last 72 hours. CBG: Recent Labs  Lab 09/03/17 0558  GLUCAP 131*   Lipid Profile: No results for input(s): CHOL, HDL, LDLCALC, TRIG, CHOLHDL, LDLDIRECT in the last 72 hours. Thyroid Function Tests: No results for input(s): TSH, T4TOTAL, FREET4, T3FREE, THYROIDAB in the last 72 hours. Anemia Panel: No results for input(s):  VITAMINB12, FOLATE, FERRITIN, TIBC, IRON, RETICCTPCT in the last 72 hours. Urine analysis:    Component Value Date/Time   COLORURINE YELLOW 02/20/2015 1440   APPEARANCEUR CLOUDY (A) 02/20/2015 1440   LABSPEC 1.012 02/20/2015 1440   PHURINE 5.0 02/20/2015 1440   GLUCOSEU NEGATIVE 02/20/2015 1440   HGBUR LARGE (A) 02/20/2015 1440   BILIRUBINUR NEGATIVE 02/20/2015 1440   KETONESUR NEGATIVE 02/20/2015 1440   PROTEINUR 30 (A) 02/20/2015 1440   UROBILINOGEN 0.2 02/20/2015 1440   NITRITE NEGATIVE 02/20/2015 1440   LEUKOCYTESUR MODERATE (A) 02/20/2015 1440   Sepsis Labs: @LABRCNTIP (procalcitonin:4,lacticidven:4) ) Recent Results (from the past 240 hour(s))  MRSA PCR Screening     Status: Abnormal   Collection Time: 09/03/17  5:22 AM  Result Value Ref Range Status   MRSA by PCR POSITIVE (A) NEGATIVE Final    Comment:        The GeneXpert MRSA Assay (FDA approved for NASAL specimens only), is one component of a comprehensive MRSA colonization surveillance program. It is not intended to diagnose MRSA infection nor to guide or monitor treatment for MRSA infections. RESULT CALLED TO, READ BACK BY AND VERIFIED WITH: Heywood Footman 0715 11.25.2018 N. MORRIS      Radiological Exams on Admission: Dg Chest Port 1 View  Result Date: 09/03/2017 CLINICAL DATA:  Shortness of breath. History of diabetes, hypertension, stroke, and atrial fibrillation. EXAM: PORTABLE CHEST 1 VIEW COMPARISON:  09/02/2017 FINDINGS: Shallow inspiration. Cardiac enlargement without vascular congestion. Increasing density in the right lung base with small right pleural effusion. This suggest developing pneumonia. No focal consolidation in the left lung. Calcification of the aorta. IMPRESSION: Cardiac enlargement. Small right pleural effusion with increasing opacity in the right lung base suggesting developing pneumonia. Electronically Signed   By: Burman Nieves M.D.   On: 09/03/2017 06:25    EKG: Independently  reviewed.  A. fib with RVR.  Assessment/Plan Principal Problem:   Acute respiratory failure with hypoxia (HCC) Active Problems:   DM (diabetes mellitus), type 2 with renal complications (HCC)   Benign essential HTN   Renal failure (ARF), acute on chronic (HCC)   Atrial fibrillation with RVR (HCC)   Acute  respiratory failure (HCC)    1. Acute respiratory failure with hypoxia likely from acute pulmonary edema secondary progressive renal failure -I have placed patient on Lasix 100 mg IV every 12.  Continue with BiPAP.  Check ABG and repeat labs including metabolic panel CBC.  I have consulted nephrologist on call Dr. Eliott Nine. 2. A. fib with RVR -patient is on Cardizem infusion.  Rate is still elevated.  Check TSH.  Continue home dose of metoprolol if blood pressure allows.  Patient is on heparin for now and hold off Xarelto due to renal failure. 3. Diabetes mellitus type 2 -hold Lantus since patient on presentation at [facility] is mildly hypoglycemic.  Will keep patient on sliding scale coverage. 4. Hypertension -blood pressure allows we will continue with metoprolol hydralazine.  Patient is already on Cardizem infusion. 5. Anemia likely from renal failure follow CBC. 6. History of COPD/asthma -continue inhalers.  I have reviewed patient's old charts and labs. Discussed with on-call nephrologist. All labs are pending.   DVT prophylaxis: Heparin infusion. Code Status: Full code. Family Communication: No family at the bedside. Disposition Plan: To be determined. Consults called: Nephrology. Admission status: Inpatient.   Eduard Clos MD [facility] Pager [phone].  If 7PM-7AM, please contact night-coverage [email or web address] Password [facility]  09/03/2017, 7:44 AM

## 2017-09-03 NOTE — Progress Notes (Addendum)
  PROGRESS NOTE  Patient admitted earlier this morning. See H&P.  Ardelle AntonMary F Howse is a 71 yo female with past medical history of chronic kidney disease stage IV on Lasix, diabetes mellitus type 2 on insulin, atrial fibrillation on Xarelto, hypertension who presented to [facility] with complaints of increasing shortness of breath, increased lower extremity edema, left arm pain.  Daughter is at bedside who provides history.  Patient lives at skilled nursing facility, was taken off of her Lasix a week ago (daughter does not know the reason).  Due to worsening edema and shortness of breath, she was brought to [facility].  Workup revealed worsening kidney function creatinine 4, hyperkalemia potassium 5.9, atrial fibrillation RVR as well as hypoxemic respiratory failure.  She was placed on BiPAP, Cardizem infusion, IV Lasix and transferred to [facility] for nephrology consultation.  On examination, she is alert, tolerating BiPAP. She has evidence of anasarca, irregular rhythm, rate controlled. Confirmed FULL code status with patient and daughter at bedside.    Acute hypoxemic respiratory failure secondary to pulmonary edema -Currently on BiPAP -Lasix 120mg  IV BID  -Strict I/Os  -Daily weight -Echo  Acute kidney injury on CKD stage IV -Baseline creatinine level 2.2 -Nephrology consulted  Hyperkalemia -Lasix as above   Atrial fibrillation RVR -?daughter believes patient was taken off her Xarelto about a month ago due to rectal bleed.  Pharmacy to verify. -Currently on IV heparin -Cardizem infusion  Diabetes mellitus type 2 -Hold Lantus as patient has been hypoglycemic, NPO due to BiPAP -Ha1c ordered  -Sliding scale insulin as needed  Hypertension -Hydralazine, lopressor   Chronic normocytic anemia of chronic kidney disease -Baseline hemoglobin 10     Noralee StainJennifer Fern Canova, DO [facility] [email or web address] Password [email or web address] 09/03/2017, 9:34 AM

## 2017-09-04 ENCOUNTER — Inpatient Hospital Stay (HOSPITAL_COMMUNITY): Payer: Medicare Other

## 2017-09-04 DIAGNOSIS — Z7189 Other specified counseling: Secondary | ICD-10-CM

## 2017-09-04 DIAGNOSIS — Z515 Encounter for palliative care: Secondary | ICD-10-CM

## 2017-09-04 LAB — CBC
HCT: 27.5 % — ABNORMAL LOW (ref 36.0–46.0)
Hemoglobin: 7.8 g/dL — ABNORMAL LOW (ref 12.0–15.0)
MCH: 24.3 pg — ABNORMAL LOW (ref 26.0–34.0)
MCHC: 28.4 g/dL — AB (ref 30.0–36.0)
MCV: 85.7 fL (ref 78.0–100.0)
PLATELETS: 268 10*3/uL (ref 150–400)
RBC: 3.21 MIL/uL — AB (ref 3.87–5.11)
RDW: 17.9 % — AB (ref 11.5–15.5)
WBC: 8.6 10*3/uL (ref 4.0–10.5)

## 2017-09-04 LAB — IRON AND TIBC
IRON: 26 ug/dL — AB (ref 28–170)
Saturation Ratios: 6 % — ABNORMAL LOW (ref 10.4–31.8)
TIBC: 434 ug/dL (ref 250–450)
UIBC: 408 ug/dL

## 2017-09-04 LAB — POTASSIUM
POTASSIUM: 6.2 mmol/L — AB (ref 3.5–5.1)
Potassium: 5.9 mmol/L — ABNORMAL HIGH (ref 3.5–5.1)

## 2017-09-04 LAB — GLUCOSE, CAPILLARY
GLUCOSE-CAPILLARY: 134 mg/dL — AB (ref 65–99)
GLUCOSE-CAPILLARY: 144 mg/dL — AB (ref 65–99)
GLUCOSE-CAPILLARY: 158 mg/dL — AB (ref 65–99)
GLUCOSE-CAPILLARY: 158 mg/dL — AB (ref 65–99)
Glucose-Capillary: 150 mg/dL — ABNORMAL HIGH (ref 65–99)
Glucose-Capillary: 156 mg/dL — ABNORMAL HIGH (ref 65–99)
Glucose-Capillary: 154 mg/dL — ABNORMAL HIGH (ref 65–99)

## 2017-09-04 LAB — RENAL FUNCTION PANEL
ANION GAP: 14 (ref 5–15)
Albumin: 3 g/dL — ABNORMAL LOW (ref 3.5–5.0)
BUN: 123 mg/dL — ABNORMAL HIGH (ref 6–20)
CALCIUM: 8.9 mg/dL (ref 8.9–10.3)
CO2: 29 mmol/L (ref 22–32)
Chloride: 98 mmol/L — ABNORMAL LOW (ref 101–111)
Creatinine, Ser: 4.4 mg/dL — ABNORMAL HIGH (ref 0.44–1.00)
GFR calc non Af Amer: 9 mL/min — ABNORMAL LOW (ref >60)
GFR, EST AFRICAN AMERICAN: 11 mL/min — AB (ref >60)
Glucose, Bld: 142 mg/dL — ABNORMAL HIGH (ref 65–99)
Phosphorus: 9.3 mg/dL — ABNORMAL HIGH (ref 2.5–4.6)
Potassium: 6 mmol/L — ABNORMAL HIGH (ref 3.5–5.1)
SODIUM: 141 mmol/L (ref 135–145)

## 2017-09-04 LAB — HEPARIN LEVEL (UNFRACTIONATED): HEPARIN UNFRACTIONATED: 0.53 [IU]/mL (ref 0.30–0.70)

## 2017-09-04 LAB — HEMOGLOBIN A1C
HEMOGLOBIN A1C: 6.4 % — AB (ref 4.8–5.6)
MEAN PLASMA GLUCOSE: 137 mg/dL

## 2017-09-04 MED ORDER — DEXTROSE 50 % IV SOLN
1.0000 | Freq: Once | INTRAVENOUS | Status: AC
  Administered 2017-09-04: 50 mL via INTRAVENOUS
  Filled 2017-09-04: qty 50

## 2017-09-04 MED ORDER — DEXTROSE 5 % IV SOLN
1.0000 g | INTRAVENOUS | Status: DC
  Administered 2017-09-04: 1 g via INTRAVENOUS
  Filled 2017-09-04 (×2): qty 10

## 2017-09-04 MED ORDER — FUROSEMIDE 10 MG/ML IJ SOLN
80.0000 mg | Freq: Three times a day (TID) | INTRAMUSCULAR | Status: DC
  Administered 2017-09-04 (×2): 80 mg via INTRAVENOUS
  Filled 2017-09-04 (×2): qty 8

## 2017-09-04 MED ORDER — INSULIN ASPART 100 UNIT/ML IV SOLN
10.0000 [IU] | Freq: Once | INTRAVENOUS | Status: AC
  Administered 2017-09-04: 10 [IU] via INTRAVENOUS

## 2017-09-04 MED ORDER — SODIUM POLYSTYRENE SULFONATE 15 GM/60ML PO SUSP
120.0000 g | Freq: Two times a day (BID) | ORAL | Status: DC
  Filled 2017-09-04 (×2): qty 480

## 2017-09-04 MED ORDER — ALBUTEROL SULFATE (2.5 MG/3ML) 0.083% IN NEBU
10.0000 mg | INHALATION_SOLUTION | Freq: Once | RESPIRATORY_TRACT | Status: AC
  Administered 2017-09-04: 10 mg via RESPIRATORY_TRACT
  Filled 2017-09-04: qty 12

## 2017-09-04 NOTE — Progress Notes (Signed)
Spoke with family and asked them if they had turned Diltiazem gtt off and 1 granddaughter states that she turned it off as it was beeping.  No one told this RN and was found off later in shift.  Turned back on and family reeducated on not turning gtt off and calling primary RN.

## 2017-09-04 NOTE — Progress Notes (Signed)
Pt refused to be put back on the bipap despite c/o SOB.  Pt using abdominal muscles.  Made aware that she needed the bipap to breath and could stop breathing without having it back on.  Still adamantly refused to be put back on.  When asked what she wanted to do, she said, "I guess I will just die."  Encouraged pt again to put mask back on.  Still refused.

## 2017-09-04 NOTE — Progress Notes (Addendum)
PROGRESS NOTE    Cassidy Atkins  TFT:[id number] DOB: 10/23/1945 DOA: 09/03/2017 PCP: Raelyn Number, MD     Brief Narrative:  Cassidy Atkins is a 71 yo female with past medical history of chronic kidney disease stage IV on Lasix, diabetes mellitus type 2 on insulin, atrial fibrillation on Xarelto, hypertension who presented to [facility] with complaints of increasing shortness of breath, increased lower extremity edema, left arm pain.  Daughter is at bedside who provides history.  Patient lives at skilled nursing facility, was taken off of her Lasix a week ago (daughter does not know the reason).  Due to worsening edema and shortness of breath, she was brought to [facility].  Workup revealed worsening kidney function creatinine 4, hyperkalemia potassium 5.9, atrial fibrillation RVR as well as hypoxemic respiratory failure.  She was placed on BiPAP, Cardizem infusion, IV Lasix and transferred to [facility] for nephrology consultation.  Assessment & Plan:   Principal Problem:   Acute respiratory failure with hypoxia (HCC) Active Problems:   DM (diabetes mellitus), type 2 with renal complications (HCC)   Benign essential HTN   Renal failure (ARF), acute on chronic (HCC)   Atrial fibrillation with RVR (HCC)   Acute respiratory failure (HCC)   Palliative care by specialist   Goals of care, counseling/discussion   Acute hypoxemic respiratory failure secondary to pulmonary edema -Currently on BiPAP -Lasix '80mg'$  IV TID with poor UOP, only 78m last 24 hours  -Strict I/Os  -Daily weight -Echo ordered   Acute kidney injury on CKD stage IV -Baseline creatinine level 2.2 -Nephrology consulted. Patient and family does not wish for HD   Hyperkalemia -Lasix as above -Given albuterol, insulin/detrose this morning -EKG obtained which was reviewed independently, shows atrial fibrillation, rate 86, no ST changes -Kayexalate rectum ordered per Nephrology. Patient  likely moving forward to full comfort care tomorrow. Will DC in favor for comfort   Atrial fibrillation RVR -Currently on IV heparin -Cardizem infusion  Diabetes mellitus type 2 -Ha1c 6.4  -Sliding scale insulin as needed  Hypertension -Hydralazine, lopressor   Chronic normocytic anemia of chronic kidney disease -Baseline hemoglobin 10   Pyuria  -Urine culture pending, started Rocephin  Morbid/severe obesity -Body mass index is 42.96 kg/m.   DVT prophylaxis: IV Heparin Code Status: DNR Family Communication: At bedside Disposition Plan: Met with family and palliative care medicine at bedside.  Palliative care medicine was consulted this morning as patient has had very poor urine output despite high dose of IV Lasix, and did not wish for dialysis.  This was verbalized by the patient and family multiple times to different providers.  During our conversation, patient's family member who is healthcare power of attorney indicated that patient is a DO NOT RESUSCITATE, did not want dialysis.  They wanted to focus on comfort and quality of life.  She stated that patient would not want this.  CODE STATUS has been changed.  Likely will move forward to comfort care tomorrow  Consultants:   Nephrology  Palliative care  Procedures:   None  Antimicrobials:  Anti-infectives (From admission, onward)   Start     Dose/Rate Route Frequency Ordered Stop   09/04/17 1000  cefTRIAXone (ROCEPHIN) 1 g in dextrose 5 % 50 mL IVPB     1 g 100 mL/hr over 30 Minutes Intravenous Every 24 hours 09/04/17 0751         Subjective: Patient without any new complaints today.  Complains of left arm pain.  Objective: Vitals:   09/04/17 0953 09/04/17 1019 09/04/17 1020 09/04/17 1203  BP:   123/70 (!) 120/54  Pulse:   91 100  Resp:   20 16  Temp:    98 F (36.7 C)  TempSrc:    Oral  SpO2: 100% 100% 99% 100%  Weight:      Height:        Intake/Output Summary (Last 24 hours) at 09/04/2017  1336 Last data filed at 09/04/2017 0603 Gross per 24 hour  Intake 685.5 ml  Output 595 ml  Net 90.5 ml   Filed Weights   09/03/17 0600 09/04/17 0425  Weight: 110.7 kg (244 lb 0.8 oz) 112 kg (247 lb)    Examination:  General exam: Appears calm, lethargic, on BiPAP Respiratory system: Clear to auscultation anteriorly, diminished overall.  BiPAP Cardiovascular system: S1 & S2 heard, irregular rhythm. No JVD, murmurs, rubs, gallops or clicks.  Diffuse anasarca Gastrointestinal system: Abdomen is nondistended, soft and nontender. No organomegaly or masses felt. Normal bowel sounds heard. Central nervous system: Alert and oriented.  Lethargic Extremities: With edema Skin: No rashes, lesions or ulcers  Data Reviewed: I have personally reviewed following labs and imaging studies  CBC: Recent Labs  Lab 09/03/17 0828 09/04/17 0505  WBC 8.6 8.6  NEUTROABS 6.2  --   HGB 7.9* 7.8*  HCT 27.1* 27.5*  MCV 85.0 85.7  PLT 254 675   Basic Metabolic Panel: Recent Labs  Lab 09/03/17 0828 09/04/17 0505 09/04/17 0739 09/04/17 1104  NA 141 141  --   --   K 5.3* 6.0* 6.2* 5.9*  CL 98* 98*  --   --   CO2 30 29  --   --   GLUCOSE 87 142*  --   --   BUN 118* 123*  --   --   CREATININE 3.79* 4.40*  --   --   CALCIUM 8.8* 8.9  --   --   PHOS 7.9* 9.3*  --   --    GFR: Estimated Creatinine Clearance: 13.9 mL/min (A) (by C-G formula based on SCr of 4.4 mg/dL (H)). Liver Function Tests: Recent Labs  Lab 09/03/17 0828 09/04/17 0505  AST 29  --   ALT 19  --   ALKPHOS 72  --   BILITOT 1.3*  --   PROT 7.6  --   ALBUMIN 3.1* 3.0*   No results for input(s): LIPASE, AMYLASE in the last 168 hours. No results for input(s): AMMONIA in the last 168 hours. Coagulation Profile: Recent Labs  Lab 09/03/17 0828  INR 1.24   Cardiac Enzymes: No results for input(s): CKTOTAL, CKMB, CKMBINDEX, TROPONINI in the last 168 hours. BNP (last 3 results) No results for input(s): PROBNP in the last  8760 hours. HbA1C: Recent Labs    09/03/17 0840  HGBA1C 6.4*   CBG: Recent Labs  Lab 09/03/17 1203 09/03/17 1628 09/03/17 2054 09/03/17 2326 09/04/17 0428  GLUCAP 87 100* 116* 134* 144*   Lipid Profile: No results for input(s): CHOL, HDL, LDLCALC, TRIG, CHOLHDL, LDLDIRECT in the last 72 hours. Thyroid Function Tests: No results for input(s): TSH, T4TOTAL, FREET4, T3FREE, THYROIDAB in the last 72 hours. Anemia Panel: Recent Labs    09/04/17 0505  TIBC 434  IRON 26*   Sepsis Labs: No results for input(s): PROCALCITON, LATICACIDVEN in the last 168 hours.  Recent Results (from the past 240 hour(s))  MRSA PCR Screening     Status: Abnormal   Collection Time: 09/03/17  5:22 AM  Result Value Ref Range Status   MRSA by PCR POSITIVE (A) NEGATIVE Final    Comment:        The GeneXpert MRSA Assay (FDA approved for NASAL specimens only), is one component of a comprehensive MRSA colonization surveillance program. It is not intended to diagnose MRSA infection nor to guide or monitor treatment for MRSA infections. RESULT CALLED TO, READ BACK BY AND VERIFIED WITH: Filbert Berthold 0715 11.25.2018 N. MORRIS        Radiology Studies: Dg Chest Port 1 View  Result Date: 09/03/2017 CLINICAL DATA:  Shortness of breath. History of diabetes, hypertension, stroke, and atrial fibrillation. EXAM: PORTABLE CHEST 1 VIEW COMPARISON:  09/02/2017 FINDINGS: Shallow inspiration. Cardiac enlargement without vascular congestion. Increasing density in the right lung base with small right pleural effusion. This suggest developing pneumonia. No focal consolidation in the left lung. Calcification of the aorta. IMPRESSION: Cardiac enlargement. Small right pleural effusion with increasing opacity in the right lung base suggesting developing pneumonia. Electronically Signed   By: Lucienne Capers M.D.   On: 09/03/2017 06:25      Scheduled Meds: . chlorhexidine  15 mL Mouth Rinse BID  . darbepoetin  (ARANESP) injection - NON-DIALYSIS  150 mcg Subcutaneous Q Sun-1800  . furosemide  80 mg Intravenous Q8H  . hydrALAZINE  37.5 mg Oral TID  . insulin aspart  0-15 Units Subcutaneous Q4H  . mouth rinse  15 mL Mouth Rinse q12n4p  . metoprolol tartrate  50 mg Oral Daily  . mometasone-formoterol  2 puff Inhalation BID  . multivitamins with iron  1 tablet Oral Daily   Continuous Infusions: . cefTRIAXone (ROCEPHIN)  IV Stopped (09/04/17 1053)  . diltiazem (CARDIZEM) infusion 12.5 mg/hr (09/04/17 0829)  . heparin 1,200 Units/hr (09/04/17 0039)     LOS: 1 day    Time spent: 50 minutes   Dessa Phi, DO [facility] [email or web address] Password [email or web address] 09/04/2017, 1:36 PM

## 2017-09-04 NOTE — Progress Notes (Signed)
ANTICOAGULATION CONSULT NOTE   Pharmacy Consult for heparin Indication: atrial fibrillation  Allergies  Allergen Reactions  . Codeine Other (See Comments)  . Contrast Allergy Premed Pack  [Prednisone & Diphenhydramine] Itching    Hives  . Lipitor [Atorvastatin] Other (See Comments)    Patient Measurements: Height: 5\' 3"  (160 cm) Weight: 247 lb (112 kg) IBW/kg (Calculated) : 52.4 Heparin Dosing Weight: 80kg  Vital Signs: Temp: 97.1 F (36.2 C) (11/26 0722) Temp Source: Axillary (11/26 0722) BP: 123/70 (11/26 1020) Pulse Rate: 91 (11/26 1020)  Assessment: 71yo female tx'd from OSH, to begin heparin for Afib.    Hx of afib but unsure if still taking xarelto HL 0.53 on 1200 units/hr  Goal of Therapy:  Heparin level = 0.3 to 0.7 Monitor platelets by anticoagulation protocol: Yes   Plan:  Heparin at 1200 units / hr Daily HL CBC  Isaac BlissMichael Tor Tsuda, PharmD, BCPS, BCCCP Clinical Pharmacist Clinical phone for 09/04/2017 from 7a-3:30p: [phone] If after 3:30p, please call main pharmacy at: [phone] 09/04/2017 10:24 AM

## 2017-09-04 NOTE — Progress Notes (Signed)
Family by to visit with pt.  Made aware of pt refusal to wear bipap.  States, "she's a DNR."  Informed them that I was aware of this but that wanted them to be aware of where things stood at this time.

## 2017-09-04 NOTE — Progress Notes (Signed)
Background: 71 y.o. year-old PMH DM, HTN, CKD4, AFib, anemia. [facility], found to be hypoxic, required BIPAP. Also AF with RVR started on dilt drip.Because of elevated creatinine, hyperkalemia (treated there), and concern about possible need for dialysis was [facility]. Poor candidate for long term HD (not there yet) and does not want dialysis regardless.  Assessment/Recommendations  1. AKI  on CKD4 - not sure what most recent creatinine has been so, but worse with Afib/CHF/?PNA. Clearly higher than 2016. Had prior episodes of AKI (2016 here) and at that time due to bedbound status not felt to be a candidate for long term HD if it ever came to that.(still in facility, non-ambulatory, [place]oy[place] to chair) She has also said to family in the past no dialysis and when asked today in presence of son and daughter indicates wishes not to have HD. 2. Acute resp failure - volume overload/AF.BIPAP. Diuretics with poor repsonse. No HD (pt wishes and would be poor candidate)  3. Volume overload/CHF -  4. AF with RVR - HR slower  5. Hyperkalemia.  Give Kaexylate PR  Subjective: Interval History: Does not want to wear BiPAP  Objective: Vital signs in last 24 hours: Temp:  [97 F (36.1 C)-97.8 F (36.6 C)] 97.1 F (36.2 C) (11/26 0722) Pulse Rate:  [72-125] 91 (11/26 1020) Resp:  [13-20] 20 (11/26 1020) BP: (88-123)/(48-78) 123/70 (11/26 1020) SpO2:  [96 %-100 %] 99 % (11/26 1020) FiO2 (%):  [28 %-30 %] 28 % (11/26 1020) Weight:  [112 kg (247 lb)] 112 kg (247 lb) (11/26 0425) Weight change: 1.338 kg (2 lb 15.2 oz)  Intake/Output from previous day: 11/25 0701 - 11/26 0700 In: 834.5 [I.V.:648.5; IV Piggyback:186] Out: 775 [Urine:775] Intake/Output this shift: No intake/output data recorded.  General appearance: obese female, lethargic, slow reponse to questions. ext 2+ edema  Lab Results: Recent Labs    09/03/17 0828 09/04/17 0505  WBC 8.6 8.6  HGB 7.9* 7.8*  HCT 27.1*  27.5*  PLT 254 268   BMET:  Recent Labs    09/03/17 0828 09/04/17 0505 09/04/17 0739  NA 141 141  --   K 5.3* 6.0* 6.2*  CL 98* 98*  --   CO2 30 29  --   GLUCOSE 87 142*  --   BUN 118* 123*  --   CREATININE 3.79* 4.40*  --   CALCIUM 8.8* 8.9  --    No results for input(s): PTH in the last 72 hours. Iron Studies:  Recent Labs    09/04/17 0505  IRON 26*  TIBC 434   Studies/Results: Dg Chest Port 1 View  Result Date: 09/03/2017 CLINICAL DATA:  Shortness of breath. History of diabetes, hypertension, stroke, and atrial fibrillation. EXAM: PORTABLE CHEST 1 VIEW COMPARISON:  09/02/2017 FINDINGS: Shallow inspiration. Cardiac enlargement without vascular congestion. Increasing density in the right lung base with small right pleural effusion. This suggest developing pneumonia. No focal consolidation in the left lung. Calcification of the aorta. IMPRESSION: Cardiac enlargement. Small right pleural effusion with increasing opacity in the right lung base suggesting developing pneumonia. Electronically Signed   By: Burman NievesWilliam  Stevens M.D.   On: 09/03/2017 06:25    Scheduled: . chlorhexidine  15 mL Mouth Rinse BID  . darbepoetin (ARANESP) injection - NON-DIALYSIS  150 mcg Subcutaneous Q Sun-1800  . hydrALAZINE  37.5 mg Oral TID  . insulin aspart  0-15 Units Subcutaneous Q4H  . mouth rinse  15 mL Mouth Rinse q12n4p  .  metoprolol tartrate  50 mg Oral Daily  . mometasone-formoterol  2 puff Inhalation BID  . multivitamins with iron  1 tablet Oral Daily     LOS: 1 day   Lauris PoagAlvin C Ayaz Sondgeroth 09/04/2017,11:25 AM

## 2017-09-04 NOTE — Consult Note (Signed)
Consultation Note Date: 09/04/2017   Patient Name: Cassidy Atkins  DOB: 11/29/1945  MRN: [id number]  Age / Sex: 71 y.o., female  PCP: Raelyn Number, MD Referring Physician: Dessa Phi, DO  Reason for Consultation: Establishing goals of care  HPI/Patient Profile: 71 y.o. female  with past medical history of CKD IV on lasix, insulin dependent DM2, COPD, HTN, anemia, HLD, and CVA admitted on 09/03/2017 with increasing shortness of breath and anasarca. Patient comes from a SNF and is non-ambulatory -transferred to wheelchair with hoyer lift at baseline. She was taken off of her lasix one week ago - family and patient unsure reason. Work up revealed working kidney function, a fib RVR, and hypoxemic respiratory failure. CXR showed small right pleural effusion with increasing opacity in the right lung base.  Patient was placed on bipap, cardizem infusion, IV lasix, and heparin infusion. Patient has received 3 doses of IV lasix and this AM, labs show worsening kidney function - creatinine 4.4 and potassium 6.2. Patient is making urine - 400 mL overnight. HR is now controlled on cardizem infusion.  PMT consulted for goals of care.  Clinical Assessment and Goals of Care: Met w/ patient at bedside. She was just taken off of bipap for the first time since admission and requesting water. She tells me to call her daughter, [name], this is the daughter that helps her with decision making. She is drowsy, but oriented x4. She tells me she has mild bilateral leg pain which is chronic. No other complaints. Conversation is limited by patient's drowsiness. She tells me she doesn't understand what is going on and I briefly explained hospital events. She confirms she does not want dialysis. Patient continued to fall asleep throughout conversation so I told her I would reach out to [employer] to continue [place] conversation.  Emotional support provided to patient.  Called  daughter [name] to set up family meeting, no answer, left voicemail.  Addendum: Met with patient, daughter [name], granddaughter Elmyra Ricks, and family friend at bedside. Pt is now back on bipap and lethargic - not participating in conversation. We discussed hospital course and poor prognosis d/t respiratory status and renal function. Dr. Maylene Roes also participated in family meeting and spoke with family regarding poor prognosis.   We discussed the differences between an aggressive medical approach and a comfort approach. After discussion, family is leaning towards comfort approach and tell me statements the patient has made to them this morning indicating she would also like to focus on comfort. [name] tells me she is HCPOA. She wants to have time this evening to discuss the patient's plan of care with the rest of the family and most likely transition to comfort care tomorrow. Offered to hold a family meeting tomorrow if needed. She will call if feels this is necessary. We discussed symptom management techniques that will be used to provide comfort. We also discussed hospice philosophy and option of residential hospice depending on family's decisions and how patient looks tomorrow.   Discussed that patient is full code - family says this is not what she would want. [name] requests DNR status. Patient also told nurse this morning she would not want to be resuscitated.   Family shared favorite memories of patient and discussed how she took care of her community. Emotional support provided.   Primary Decision Maker PATIENT  daughter [name] is HCPOA - to bring paperwork tomorrow  SUMMARY OF RECOMMENDATIONS   - PMT will continue to reach out to family to setup family  meeting.   Addendum: Held family meeting - Will likely transition to comfort care tomorrow w/ removal of bipap and symptom management  Code Status/Advance Care Planning:  DNR   Symptom Management:   None at this time  Palliative Prophylaxis:     Aspiration, Delirium Protocol, Frequent Pain Assessment, Oral Care and Turn Reposition  Additional Recommendations (Limitations, Scope, Preferences):  Full Scope Treatment for now, comfort care tomorrow after daughter speaks with other family  Psycho-social/Spiritual:   Desire for further Chaplaincy support:no  Additional Recommendations: Education on Hospice  Prognosis:   days  Discharge Planning: To Be Determined- anticipate hospital death vs residential hospice if we transition to comfort care     Primary Diagnoses: Present on Admission: . Acute respiratory failure with hypoxia ([place]) . Renal failure (ARF), acute on chronic (HCC) . Atrial fibrillation with RVR ([place]) . DM (diabetes mellitus), type 2 with renal complications ([place]) . Benign essential HTN . Acute respiratory failure ([place])   I have reviewed the medical record, interviewed the patient and family, and examined the patient. The following aspects are pertinent.  Past Medical History:  Diagnosis Date  . Anasarca   . CKD (chronic kidney disease), stage III ([place])   . DM (diabetes mellitus), type 2 with renal complications ([place])   . History of CVA (cerebrovascular accident)   . HTN (hypertension)   . Hypercholesterolemia   . Paroxysmal atrial fibrillation (HCC)    Social History   Socioeconomic History  . Marital status: Widowed    Spouse name: None  . Number of children: None  . Years of education: None  . Highest education level: None  Social Needs  . Financial resource strain: None  . Food insecurity - worry: None  . Food insecurity - inability: None  . Transportation needs - medical: None  . Transportation needs - non-medical: None  Occupational History  . None  Tobacco Use  . Smoking status: Never Smoker  . Smokeless tobacco: Never Used  Substance and Sexual Activity  . Alcohol use: No  . Drug use: No  . Sexual activity: None  Other Topics Concern  . None  Social History Narrative    Lives in nursing home.   Family History  Problem Relation Age of Onset  . Diabetes Mellitus II Mother   . Diabetes Mellitus II Father    Scheduled Meds: . chlorhexidine  15 mL Mouth Rinse BID  . darbepoetin (ARANESP) injection - NON-DIALYSIS  150 mcg Subcutaneous Q Sun-1800  . hydrALAZINE  37.5 mg Oral TID  . insulin aspart  0-15 Units Subcutaneous Q4H  . mouth rinse  15 mL Mouth Rinse q12n4p  . metoprolol tartrate  50 mg Oral Daily  . mometasone-formoterol  2 puff Inhalation BID  . multivitamins with iron  1 tablet Oral Daily   Continuous Infusions: . cefTRIAXone (ROCEPHIN)  IV    . diltiazem (CARDIZEM) infusion 12.5 mg/hr (09/04/17 0829)  . furosemide Stopped (09/04/17 0603)  . heparin 1,200 Units/hr (09/04/17 0039)   PRN Meds:.acetaminophen **OR** acetaminophen, ipratropium-albuterol, ondansetron **OR** ondansetron (ZOFRAN) IV Allergies  Allergen Reactions  . Codeine Other (See Comments)  . Contrast Allergy Premed Pack  [Prednisone & Diphenhydramine] Itching    Hives  . Lipitor [Atorvastatin] Other (See Comments)   Review of Systems  Constitutional: Positive for fatigue.  Respiratory: Positive for cough and shortness of breath.   Cardiovascular: Positive for leg swelling. Negative for chest pain.  Musculoskeletal:       Bilateral leg pain, chronic  Neurological: Positive for weakness.    Physical Exam  Constitutional: She is oriented to person, place, and time. She appears lethargic. She is cooperative. She does not have a sickly appearance. No distress.  Anasarca present  HENT:  Head: Normocephalic and atraumatic.  Cardiovascular: A regularly irregular rhythm present.  Pulses:      Radial pulses are 1+ on the right side, and 1+ on the left side.       Dorsalis pedis pulses are 0 on the right side, and 0 on the left side.  Pulmonary/Chest: Accessory muscle usage present. No respiratory distress. She has decreased breath sounds in the right upper field, the right  lower field and the left lower field. She has wheezes in the left upper field.  Abdominal: Soft. Bowel sounds are decreased. There is no tenderness.  Genitourinary:  Genitourinary Comments: Foley in place, small amount of amber urine w/sediment  Neurological: She is oriented to person, place, and time. She appears lethargic.  Skin: Skin is warm and dry. She is not diaphoretic.  Psychiatric: Her speech is delayed. She is slowed and withdrawn. Cognition and memory are normal. She is inattentive.    Vital Signs: BP (!) 109/58   Pulse 81   Temp (!) 97.1 F (36.2 C) (Axillary)   Resp 16   Ht '5\' 3"'$  (1.6 m)   Wt 112 kg (247 lb)   SpO2 100%   BMI 43.75 kg/m  Pain Assessment: No/denies pain   Pain Score: Asleep(pt. resting with eyes closed)   SpO2: SpO2: 100 % O2 Device:SpO2: 100 % O2 Flow Rate: .O2 Flow Rate (L/min): 2 L/min  IO: Intake/output summary:   Intake/Output Summary (Last 24 hours) at 09/04/2017 1000 Last data filed at 09/04/2017 0603 Gross per 24 hour  Intake 766.5 ml  Output 775 ml  Net -8.5 ml    LBM: Last BM Date: 09/03/17 Baseline Weight: Weight: 110.7 kg (244 lb 0.8 oz) Most recent weight: Weight: 112 kg (247 lb)     Palliative Assessment/Data: 30%      Time Total: 135 minutes Greater than 50%  of this time was spent counseling and coordinating care related to the above assessment and plan.  Juel Burrow, DNP, AGNP-C Palliative Medicine Team [phone]

## 2017-09-04 NOTE — Care Management Note (Signed)
Case Management Note  Patient Details  Name: Cassidy Atkins MRN: [id number] Date of Birth: 10/16/1945  Subjective/Objective:  From [facility], [facility] with AKI, Resp failure, volume overload,chf, afib with rvr, anemia, has hx of DM, htn, ckd stage 4.  Patient required bipap and cardizem drip, cre elevated and k elevated ,was transferred [facility] for concern for need for Dialysis, per renal patient is poor candidate for long term HD, not at this point yet and patient does not want dialysis.                   Action/Plan: NCM will follow for dc needs.   Expected Discharge Date:                  Expected Discharge Plan:  Skilled Nursing Facility  In-House Referral:  Clinical Social Work  Discharge planning Services  CM Consult  Post Acute Care Choice:    Choice offered to:     DME Arranged:    DME Agency:     HH Arranged:    HH Agency:     Status of Service:  In process, will continue to follow  If discussed at Long Length of Stay Meetings, dates discussed:    Additional Comments:  Leone Havenaylor, Casey Maxfield Clinton, RN 09/04/2017, 1:02 PM

## 2017-09-05 ENCOUNTER — Other Ambulatory Visit (HOSPITAL_COMMUNITY): Payer: Medicare Other

## 2017-09-05 DIAGNOSIS — J811 Chronic pulmonary edema: Secondary | ICD-10-CM | POA: Diagnosis present

## 2017-09-05 DIAGNOSIS — N184 Chronic kidney disease, stage 4 (severe): Secondary | ICD-10-CM | POA: Diagnosis present

## 2017-09-05 DIAGNOSIS — E875 Hyperkalemia: Secondary | ICD-10-CM | POA: Diagnosis present

## 2017-09-05 DIAGNOSIS — D631 Anemia in chronic kidney disease: Secondary | ICD-10-CM

## 2017-09-05 DIAGNOSIS — N179 Acute kidney failure, unspecified: Secondary | ICD-10-CM | POA: Diagnosis present

## 2017-09-05 DIAGNOSIS — N189 Chronic kidney disease, unspecified: Secondary | ICD-10-CM

## 2017-09-05 LAB — GLUCOSE, CAPILLARY
GLUCOSE-CAPILLARY: 146 mg/dL — AB (ref 65–99)
GLUCOSE-CAPILLARY: 199 mg/dL — AB (ref 65–99)

## 2017-09-05 LAB — URINE CULTURE

## 2017-09-05 MED ORDER — SODIUM CHLORIDE 0.9 % IV BOLUS (SEPSIS)
250.0000 mL | Freq: Once | INTRAVENOUS | Status: AC
  Administered 2017-09-05: 250 mL via INTRAVENOUS

## 2017-09-05 MED ORDER — ATROPINE SULFATE 1 MG/10ML IJ SOSY
PREFILLED_SYRINGE | INTRAMUSCULAR | Status: AC
  Administered 2017-09-05: 03:00:00
  Filled 2017-09-05: qty 10

## 2017-09-09 NOTE — Progress Notes (Addendum)
Walked in pt room at 2:45 pt appeared asleep.  Responded to pain by grimacing and opening eyes. Pt was brady in the high 50's and BP was dropping.  Called for help at that time pt is a DNR.

## 2017-09-09 NOTE — Death Summary Note (Signed)
DEATH SUMMARY   Patient Details  Name: Cassidy Atkins MRN: [id number] DOB: 01/09/1946  Admission/Discharge Information   Admit Date:  09/03/2017  Date of Death: Date of Death: April 08, 2017  Time of Death: Time of Death: 0315  Length of Stay: 2  Referring Physician: Shelbie Atkins, Cassidy P, MD   Reason(s) for Hospitalization  Acute on hypoxemic respiratory failure secondary to pulmonary edema, AKI on CKD stage IV, hyperkalemia A Fib RVR   Diagnoses  Preliminary cause of death: Cardiac arrest (HCC) Secondary Diagnoses (including complications and co-morbidities):  Principal Problem:   Acute respiratory failure with hypoxia (HCC) Active Problems:   DM (diabetes mellitus), type 2 with renal complications (HCC)   Benign essential HTN   Renal failure (ARF), acute on chronic (HCC)   Atrial fibrillation with RVR (HCC)   Acute respiratory failure (HCC)   Palliative care by specialist   Goals of care, counseling/discussion   Pulmonary edema   AKI (acute kidney injury) (HCC)   CKD (chronic kidney disease) stage 4, GFR 15-29 ml/min (HCC)   Hyperkalemia   Anemia due to chronic kidney disease  Brief Hospital Course (including significant findings, care, treatment, and services provided and events leading to death)  Cassidy MayhewMary F Armstrongis a 71 yo female with pastmedical history ofchronic kidney disease stage IV on Lasix, diabetes mellitus type 2 on insulin, atrial fibrillation on Xarelto, hypertension who presented to [facility] with complaints of increasing shortness of breath, increased lower extremity edema, left arm pain. Daughter is at bedside who provides history. Patient lives at skilled nursing facility, was taken off of her Lasix a week ago (daughter does not know the reason).Due to worsening edema and shortness of breath, she was brought to [facility]. Workup revealed worsening kidney function creatinine 4, hyperkalemia potassium 5.9, atrial fibrillation RVR as well as  hypoxemic respiratory failure. She was placed on BiPAP, Cardizem infusion, IV Lasix and [facility] nephrology consultation.  During hospitalization, patient continued on IV lasix with poor urine output. Cr continued to worsen, with hyperkalemia. She and family opted to not undergo dialysis. Palliative care was consulted. Family decided on DNR status. Overnight on 11/26, patient was found lethargic, with hypotension and bradycardia. Cardizem gtt was stopped. She was given IV atropine without response. She was pronounced deceased.    Pertinent Labs and Studies  Significant Diagnostic Studies Dg Chest Port 1 View  Result Date: 09/03/2017 CLINICAL DATA:  Shortness of breath. History of diabetes, hypertension, stroke, and atrial fibrillation. EXAM: PORTABLE CHEST 1 VIEW COMPARISON:  09/02/2017 FINDINGS: Shallow inspiration. Cardiac enlargement without vascular congestion. Increasing density in the right lung base with small right pleural effusion. This suggest developing pneumonia. No focal consolidation in the left lung. Calcification of the aorta. IMPRESSION: Cardiac enlargement. Small right pleural effusion with increasing opacity in the right lung base suggesting developing pneumonia. Electronically Signed   By: Burman NievesWilliam  Stevens M.D.   On: 09/03/2017 06:25    Microbiology Recent Results (from the past 240 hour(s))  MRSA PCR Screening     Status: Abnormal   Collection Time: 09/03/17  5:22 AM  Result Value Ref Range Status   MRSA by PCR POSITIVE (A) NEGATIVE Final    Comment:        The GeneXpert MRSA Assay (FDA approved for NASAL specimens only), is one component of a comprehensive MRSA colonization surveillance program. It is not intended to diagnose MRSA infection nor to guide or monitor treatment for MRSA infections. RESULT CALLED TO, READ  BACK BY AND VERIFIED WITH: Heywood FootmanJ. TAYLOR 0715 11.25.2018 N. MORRIS   Culture, Urine     Status: Abnormal   Collection  Time: 09/04/17  8:20 AM  Result Value Ref Range Status   Specimen Description URINE, CATHETERIZED  Final   Special Requests NONE  Final   Culture MULTIPLE SPECIES PRESENT, SUGGEST RECOLLECTION (A)  Final   Report Status Aug 25, 2017 FINAL  Final    Lab Basic Metabolic Panel: Recent Labs  Lab 09/03/17 0828 09/04/17 0505 09/04/17 0739 09/04/17 1104  NA 141 141  --   --   K 5.3* 6.0* 6.2* 5.9*  CL 98* 98*  --   --   CO2 30 29  --   --   GLUCOSE 87 142*  --   --   BUN 118* 123*  --   --   CREATININE 3.79* 4.40*  --   --   CALCIUM 8.8* 8.9  --   --   PHOS 7.9* 9.3*  --   --    Liver Function Tests: Recent Labs  Lab 09/03/17 0828 09/04/17 0505  AST 29  --   ALT 19  --   ALKPHOS 72  --   BILITOT 1.3*  --   PROT 7.6  --   ALBUMIN 3.1* 3.0*   No results for input(s): LIPASE, AMYLASE in the last 168 hours. No results for input(s): AMMONIA in the last 168 hours. CBC: Recent Labs  Lab 09/03/17 0828 09/04/17 0505  WBC 8.6 8.6  NEUTROABS 6.2  --   HGB 7.9* 7.8*  HCT 27.1* 27.5*  MCV 85.0 85.7  PLT 254 268   Cardiac Enzymes: No results for input(s): CKTOTAL, CKMB, CKMBINDEX, TROPONINI in the last 168 hours. Sepsis Labs: Recent Labs  Lab 09/03/17 0828 09/04/17 0505  WBC 8.6 8.6    Procedures/Operations  None   Noralee StainJennifer Nanda Bittick, DO [facility] Aug 25, 2017, 10:31 AM

## 2017-09-09 NOTE — Progress Notes (Signed)
Patient pronounced at [id number] by myself and Gena Frayhris RN.  CCMD called prior to state patient had a bradycardic heart rate.  Cardizem stopped immediately.  Upon entering room patient was drowsy responding only to pain with eyes open.  Performed 12 Lead EKG A-fib with slow ventricle. BS 146.  IVF bolus started and noted patient had passed  A heavy amount of gas.  Rapid response called and told to push Atropine with no response.  [facility] was paged during this time and then paged again to say she was pronounced.  Called and talked to family Burna MortimerWanda and Drinda Buttsnnette heading to hospital to see mom. [facility] MD's stopped by and dropped off death certificate.

## 2017-09-09 NOTE — Progress Notes (Signed)
Family came and recently left room.  Gave funeral home to go to. Patient taken to morgue.  See Post Mortem sheet.  Bed control contacted.

## 2017-09-09 NOTE — Progress Notes (Addendum)
RN called about patient being hypotensive at 0300 78/42, RN already paged [facility] APP, I asked if the patient needed fluids, while the RN was talking to me, patient's HR dropped into the 30-40s, patient was on Cardizem drip earlier which was stopped at 245. HR was in and out of junctional rhythm per RNs.  Instructed to the RN to give Atropine (was bradycardic after "vagal" episode and was on cardizem drip earlier) and that I was on my way. RN had initiated a fluid bolus as well prior to my arrival.   RN did inform me that patient was a DNR. When I arrived at 313, patient had HR of 0, no pulse present. Atropine was given prior to my arrival but HR did not improve per RN. Foaming around the patient's mouth, patient was on BIPAP, taken off after HR was 0.Two bedside RNs pronouced, primary service was notified.  Call Time 307 Start Time 313 End Time 325

## 2017-09-09 DEATH — deceased

## 2018-09-27 IMAGING — DX DG CHEST 1V PORT
1 series · 1 of 1 positions shown · non-contrast
Comparison: 09/02/2017

CLINICAL DATA: Shortness of breath. History of diabetes,
hypertension, stroke, and atrial fibrillation.

EXAM:
PORTABLE CHEST 1 VIEW

[chest]
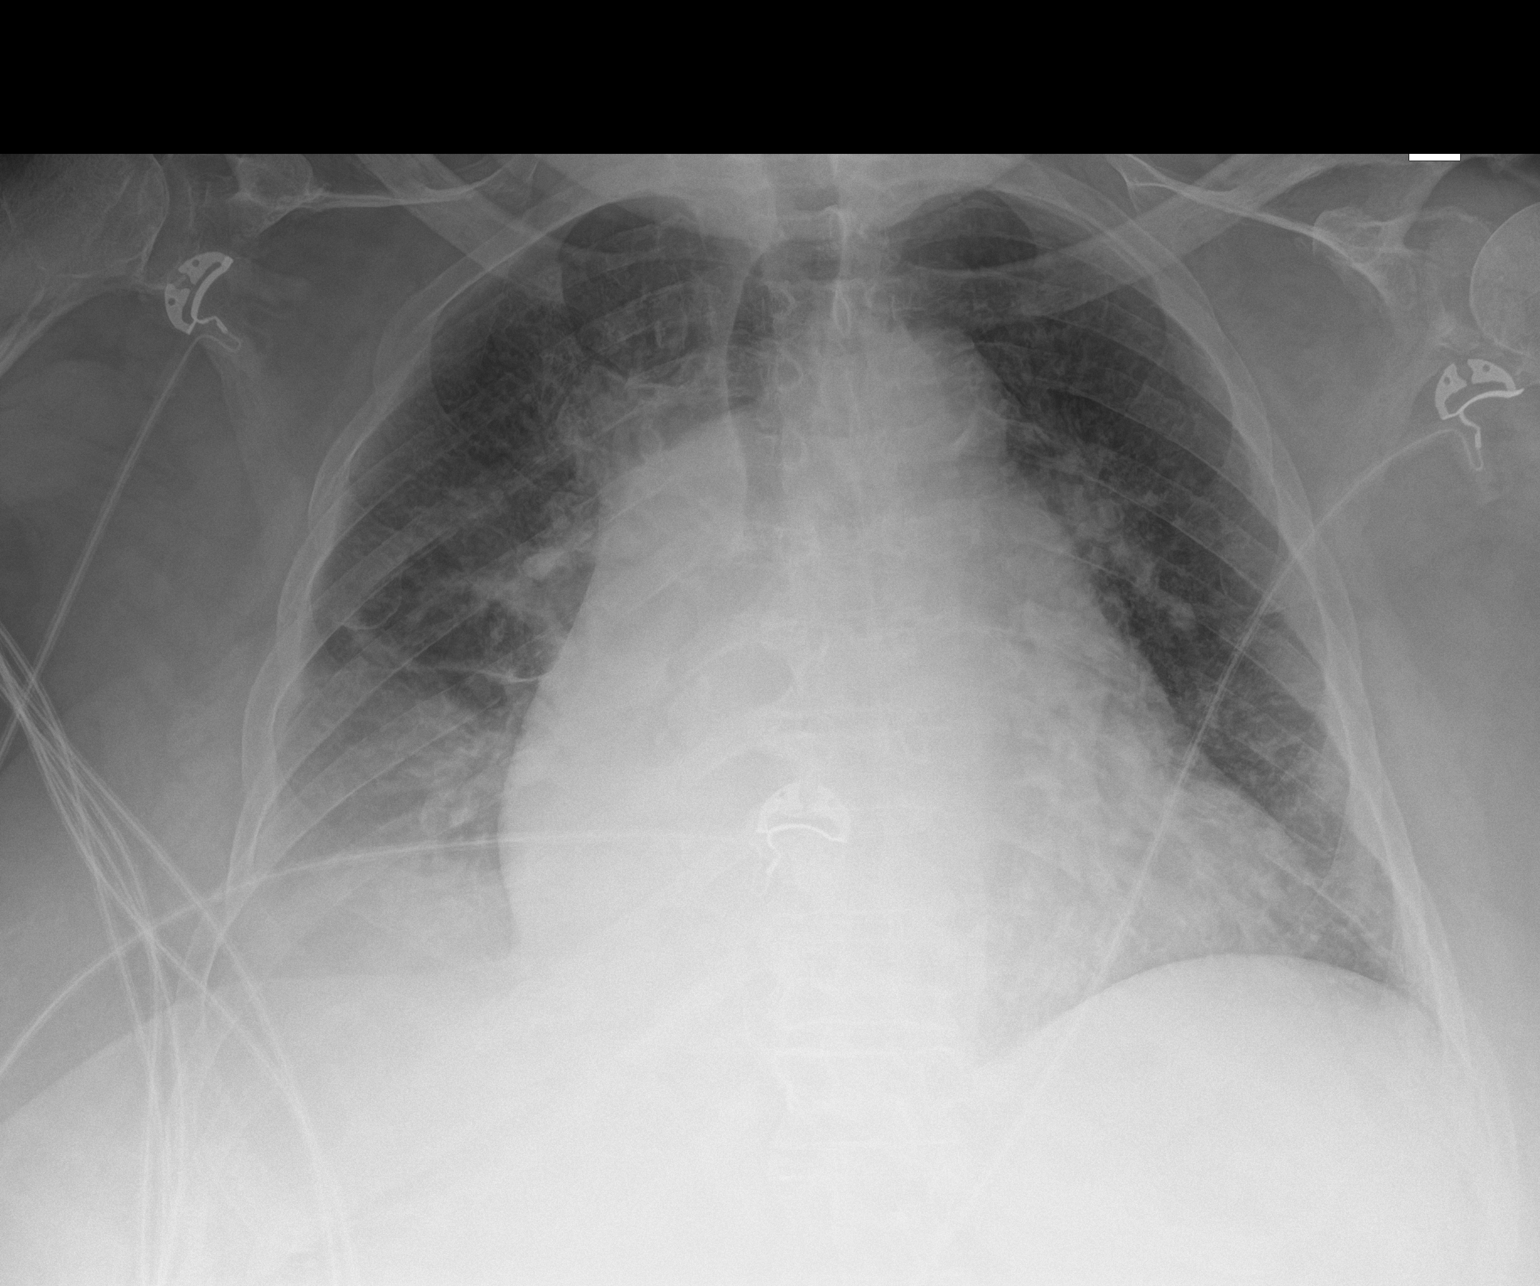

[1 of 1 positions shown; findings below may reference images not displayed]

FINDINGS: Shallow inspiration. Cardiac enlargement without vascular
congestion. Increasing density in the right lung base with small
right pleural effusion. This suggest developing pneumonia. No focal
consolidation in the left lung. Calcification of the aorta.
IMPRESSION: Cardiac enlargement. Small right pleural effusion with increasing
opacity in the right lung base suggesting developing pneumonia.
